# Patient Record
Sex: Female | Born: 1950 | Race: White | Hispanic: No | Marital: Married | Smoking: Never smoker
Health system: Southern US, Community
[De-identification: ages and names within clinical notes are randomized; demographics above are authoritative.]

## PROBLEM LIST (undated history)

## (undated) DIAGNOSIS — I10 Essential (primary) hypertension: Secondary | ICD-10-CM

## (undated) DIAGNOSIS — E669 Obesity, unspecified: Secondary | ICD-10-CM

## (undated) HISTORY — PX: CATARACT EXTRACTION W/ INTRAOCULAR LENS IMPLANT: SHX1309

## (undated) HISTORY — DX: Essential (primary) hypertension: I10

## (undated) HISTORY — PX: VEIN LIGATION AND STRIPPING: SHX2653

## (undated) HISTORY — DX: Obesity, unspecified: E66.9

---

## 1992-03-28 HISTORY — PX: ABDOMINAL HYSTERECTOMY: SHX81

## 2002-03-28 HISTORY — PX: ANTERIOR AND POSTERIOR VAGINAL REPAIR: SUR5

## 2012-07-11 ENCOUNTER — Encounter (INDEPENDENT_AMBULATORY_CARE_PROVIDER_SITE_OTHER): Payer: Self-pay | Admitting: Surgery

## 2012-07-11 ENCOUNTER — Ambulatory Visit (INDEPENDENT_AMBULATORY_CARE_PROVIDER_SITE_OTHER): Payer: 59 | Admitting: Surgery

## 2012-07-11 ENCOUNTER — Other Ambulatory Visit (INDEPENDENT_AMBULATORY_CARE_PROVIDER_SITE_OTHER): Payer: Self-pay

## 2012-07-11 NOTE — Progress Notes (Addendum)
Re:   Joy Charles DOB:   December 05, 1950 MRN:   [id number]  ASSESSMENT AND PLAN: 1.  Morbid obesity  Per the 1991 NIH Consensus Statement, the patient is a candidate for bariatric surgery.  The patient attended our initial information session and reviewed the types of bariatric surgery.    The patient is not sure what bariatric operation she wants.  We discussed lap band (her husband), sleeve (her husband's brother), and RYGB [facility], a patient of mine).  I discussed with the patient the indications and risks of bariatric surgery.  The potential risks of surgery include, but are not limited to, bleeding, infection, leak from the bowel, DVT and PE, open surgery, long term nutrition consequences, and death.  The patient understands the importance of compliance and long term follow-up with our group after surgery.  From here we will obtain lab tests, x-rays, nutrition consult, and psych consult.  She wants to do her psych eval near [place], which should be okay.  She brought some lab work with her.  2.  Hypertension 3.  GERD  [UGI - 07/25/2012 - small hiatal hernia.  DN 08/01/2012] [US - 07/15/2012 - gallstones.  DN 08/07/2012] [In the psych eval Joy Charles), there is mention of opening soft drinks 2 days before she drinks them so they are flat.  DN  09/19/2012]  Chief Complaint  Patient presents with  . Bariatric Pre-op    Initial   REFERRING PHYSICIAN: [name],[name], MD  HISTORY OF PRESENT ILLNESS: Joy Charles is a 61 y.o. (DOB: 1950/09/09)  white  female whose primary care physician is Joy Skeeters, MD Joy Charles) and comes to me today for bariatric surgery.  She has been overweight much of her adult life, particularly since she has had her kids.  She did one time diet down to 147 on Atkin's many years ago.  The patient's husband, Joy Charles, is a LAP-BAND patient of mine. I did his LAP-BAND on 11/03/2009 and he has been successful with weight loss. She has been to  one of our information sessions. She has tried multiple diets including Weight Watchers, Atkins, Slim fast, and the diet in which she did not eat after 2 PM. She is currently 4 of 6 months into a physicians supervised weight loss plan.  At this time, she is undecided about what bariatrics surgery she wants to pursue.  Her husband had the Lap Band.  They have a friend, Joy Charles ([id number]) who is a RYGB that I did.  They also have heard about the gastric sleeve.   Past Medical History  Diagnosis Date  . Hypertension       Past Surgical History  Procedure Laterality Date  . Abdominal hysterectomy    . Vein ligation and stripping        Current Outpatient Prescriptions  Medication Sig Dispense Refill  . amLODipine (NORVASC) 10 MG tablet Take 10 mg by mouth daily.      Marland Kitchen losartan (COZAAR) 25 MG tablet Take 25 mg by mouth daily.       No current facility-administered medications for this visit.      Allergies  Allergen Reactions  . Cephalosporins Hives and Rash  . Sulfur Rash    REVIEW OF SYSTEMS: Skin:  No history of rash.  No history of abnormal moles. Infection:   No history of MRSA. Neurologic:  No history of stroke.  No history of seizure.  No history of headaches. Cardiac:  Hypertension x 10 years.  History  of "phlebitis", but not DVT. Pulmonary:  Does not smoke cigarettes.  No asthma or bronchitis.  No OSA/CPAP.  Endocrine:  No diabetes. No thyroid disease. Gastrointestinal:  GERD, but doing well now and not on meds.  No history of liver disease.  No history of gall bladder disease.  No history of pancreas disease.  No history of colon disease.  Colonoscopy at [facility] at age 33. Urologic:  No history of kidney stones.  No history of bladder infections. Musculoskeletal:  No musculoskeletal disease. Hematologic:  No bleeding disorder.  No history of anemia.  Not anticoagulated. Psycho-social:  The patient is oriented.   The patient has no obvious psychologic or  social impairment to understanding our conversation and plan.  SOCIAL and FAMILY HISTORY: Joy Charles Joy Charles ([id number]), is lap band patient of mine. They have two boys, 33 and 34.  PHYSICAL EXAM: BP 142/64  Pulse 72  Temp(Src) 98.1 F (36.7 C) (Temporal)  Resp 16  Ht 5\' 3"  (1.6 m)  Wt 215 lb 9.6 oz (97.796 kg)  BMI 38.2 kg/m2  General: Obese WN WF who is alert and generally healthy appearing.  HEENT: Normal. Pupils equal. Neck: Supple. No mass.  No thyroid mass. Lymph Nodes:  No supraclavicular or cervical nodes. Lungs: Clear to auscultation and symmetric breath sounds. Heart:  RRR. No murmur or rub. Abdomen: Soft. No mass. No tenderness. No hernia. Normal bowel sounds.  She has a lower midline abdominal incision from hysterectomy/ruptured bladder.  She is a little more apple than pear in shape,. Rectal: No mass.  Guaiac neg. Extremities:  Good strength and ROM  in upper and lower extremities. Neurologic:  Grossly intact to motor and sensory function. Psychiatric: Has normal mood and affect. Behavior is normal.   DATA REVIEWED: Data from Dr. Truddie Crumble in [place], [place].  Joy Kin, MD,  [facility] [address]  [address]   [place], [place]    [address] Phone:  [phone] FAX:  [phone]

## 2012-07-16 ENCOUNTER — Encounter (INDEPENDENT_AMBULATORY_CARE_PROVIDER_SITE_OTHER): Payer: Self-pay

## 2012-07-25 ENCOUNTER — Other Ambulatory Visit: Payer: Self-pay

## 2012-07-25 ENCOUNTER — Ambulatory Visit (HOSPITAL_COMMUNITY)
Admission: RE | Admit: 2012-07-25 | Discharge: 2012-07-25 | Disposition: A | Discharge status: Home / Self Care | Payer: 59 | Source: Ambulatory Visit | Attending: Surgery | Admitting: Surgery

## 2012-07-25 DIAGNOSIS — K8020 Calculus of gallbladder without cholecystitis without obstruction: Secondary | ICD-10-CM | POA: Insufficient documentation

## 2012-07-25 DIAGNOSIS — I10 Essential (primary) hypertension: Secondary | ICD-10-CM | POA: Insufficient documentation

## 2012-07-25 DIAGNOSIS — Z6838 Body mass index (BMI) 38.0-38.9, adult: Secondary | ICD-10-CM | POA: Insufficient documentation

## 2012-07-25 DIAGNOSIS — K449 Diaphragmatic hernia without obstruction or gangrene: Secondary | ICD-10-CM | POA: Insufficient documentation

## 2012-07-25 DIAGNOSIS — K219 Gastro-esophageal reflux disease without esophagitis: Secondary | ICD-10-CM | POA: Insufficient documentation

## 2012-07-25 DIAGNOSIS — K224 Dyskinesia of esophagus: Secondary | ICD-10-CM | POA: Insufficient documentation

## 2012-07-25 DIAGNOSIS — K7689 Other specified diseases of liver: Secondary | ICD-10-CM | POA: Insufficient documentation

## 2012-08-01 ENCOUNTER — Ambulatory Visit (HOSPITAL_COMMUNITY)
Admission: RE | Admit: 2012-08-01 | Discharge: 2012-08-01 | Disposition: A | Discharge status: Home / Self Care | Payer: 59 | Source: Ambulatory Visit | Attending: Surgery | Admitting: Surgery

## 2012-08-01 ENCOUNTER — Encounter: Payer: 59 | Attending: Surgery | Admitting: *Deleted

## 2012-08-01 ENCOUNTER — Encounter: Payer: Self-pay | Admitting: *Deleted

## 2012-08-01 ENCOUNTER — Encounter (HOSPITAL_COMMUNITY)
Admission: RE | Disposition: A | Discharge status: Home / Self Care | Payer: Self-pay | Source: Ambulatory Visit | Attending: Surgery

## 2012-08-01 DIAGNOSIS — Z01818 Encounter for other preprocedural examination: Secondary | ICD-10-CM | POA: Insufficient documentation

## 2012-08-01 DIAGNOSIS — E669 Obesity, unspecified: Secondary | ICD-10-CM | POA: Insufficient documentation

## 2012-08-01 DIAGNOSIS — Z713 Dietary counseling and surveillance: Secondary | ICD-10-CM | POA: Insufficient documentation

## 2012-08-01 HISTORY — PX: BREATH TEK H PYLORI: SHX5422

## 2012-08-01 SURGERY — BREATH TEST, FOR HELICOBACTER PYLORI

## 2012-08-01 NOTE — Progress Notes (Signed)
  Pre-Op Assessment Visit:  Pre-Operative LAGB Surgery  Medical Nutrition Therapy:  Appt start time: 0915   End time:  1015.  Patient was seen on 08/01/2012 for Pre-Operative LAGB Nutrition Assessment. Assessment and letter of approval faxed to [facility] Bariatric Surgery Program coordinator on 08/01/2012.  Approval letter sent to [facility] center and will be available in the chart under the media tab.  Handouts given during visit include:  Pre-Op Goals   Bariatric Surgery Protein Shakes  Samples given during visit include:   Unjury Protein Powder: 1 pkt Lot: [id number]; Exp: 04/15  FreedaVite MVI: 2 bottles @ 5 tabs/bottle Lot: [id number]; Exp: 03/17  Patient to call for Pre-Op and Post-Op Nutrition Education at [facility] when surgery is scheduled.

## 2012-08-01 NOTE — Patient Instructions (Addendum)
   Follow Pre-Op Nutrition Goals to prepare for Lapband Surgery.   Call the [facility] at [phone] once you have been given your surgery date to enrolled in the Pre-Op Nutrition Class. You will need to attend this nutrition class 3-4 weeks prior to your surgery. 

## 2012-08-02 ENCOUNTER — Encounter (HOSPITAL_COMMUNITY): Payer: Self-pay | Admitting: Surgery

## 2012-11-14 ENCOUNTER — Other Ambulatory Visit (INDEPENDENT_AMBULATORY_CARE_PROVIDER_SITE_OTHER): Payer: Self-pay | Admitting: Surgery

## 2012-12-25 ENCOUNTER — Encounter (INDEPENDENT_AMBULATORY_CARE_PROVIDER_SITE_OTHER): Payer: Self-pay

## 2012-12-28 ENCOUNTER — Ambulatory Visit (INDEPENDENT_AMBULATORY_CARE_PROVIDER_SITE_OTHER): Payer: 59 | Admitting: Surgery

## 2013-01-01 ENCOUNTER — Encounter: Payer: Self-pay | Admitting: *Deleted

## 2013-01-01 NOTE — Progress Notes (Signed)
Email sent to patient at [email or web address] explaining that she DOES need to attend Pre-Op Class and Post-Op Class. Also urged her to attend f/u appts to increase the success of her surgery.

## 2013-01-03 ENCOUNTER — Encounter (INDEPENDENT_AMBULATORY_CARE_PROVIDER_SITE_OTHER): Payer: Self-pay | Admitting: Surgery

## 2013-01-03 ENCOUNTER — Ambulatory Visit (INDEPENDENT_AMBULATORY_CARE_PROVIDER_SITE_OTHER): Payer: 59 | Admitting: Surgery

## 2013-01-03 DIAGNOSIS — I10 Essential (primary) hypertension: Secondary | ICD-10-CM

## 2013-01-03 DIAGNOSIS — Z6838 Body mass index (BMI) 38.0-38.9, adult: Secondary | ICD-10-CM

## 2013-01-03 NOTE — Progress Notes (Addendum)
Re:   Joy Charles DOB:   24-Nov-1950 MRN:   [id number]  ASSESSMENT AND PLAN: 1.  Morbid obesity  Per the 1991 NIH Consensus Statement, the patient is a candidate for bariatric surgery.  The patient attended our initial information session and reviewed the types of bariatric surgery.    The patient is not sure what bariatric operation she wants.  We discussed lap band (her husband), sleeve (her husband's brother), and RYGB Joy Charles, a patient of mine).  I discussed with the patient the indications and risks of bariatric surgery.  The potential risks of surgery include, but are not limited to, bleeding, infection, leak from the bowel, DVT and PE, open surgery, long term nutrition consequences, and death.  The patient understands the importance of compliance and long term follow-up with our group after surgery.  The patient has decided to have a lap band.  We discussed this decision.  She lives in [place], so we will keep her overnight.  She knows the immediate risks of lap band surgery and the long term risks with the lap band (including slippage and erosion of the lap band).  Patient has a small HH on UGI.  Will check this at the time of surgery and repair if necessary.  2.  Hypertension 3.  GERD  She has a small HH on UGI - we talked about repair of the [facility] 4.  Gallstones  Her mother and GM had gall stones.  We talked about following this.  I gave her a book on gall bladder disease.  Chief Complaint  Patient presents with  . Bariatric Pre-op    lap band   REFERRING PHYSICIAN: [name],[name], MD  HISTORY OF PRESENT ILLNESS: Joy Charles is a 62 y.o. (DOB: 04/13/1950)  white  female whose primary care physician is Joy Skeeters, MD Joy Charles) and comes to me today for bariatric surgery.   She is with her husband. I reviewed her evaluations and tests.  I answered questions about the lap band and the surgery.  I think she has a good handle on this because her  husband has done well.  UGI - 07/25/2012 - small hiatal hernia. [place] - 07/15/2012 - gallstones.   In the psych eval Joy Charles), there is mention of opening soft drinks 2 days before she drinks them so they are flat.  DN  09/19/2012  History of obesity: She has been overweight much of her adult life, particularly since she has had her kids.  She did one time diet down to 147 on Atkin's many years ago.  The patient's husband, Joy Charles, is a LAP-BAND patient of mine. I did his LAP-BAND on 11/03/2009 and he has been successful with weight loss. She has been to one of our information sessions. She has tried multiple diets including Weight Watchers, Atkins, Slim fast, and the diet in which she did not eat after 2 PM. She is currently 4 of 6 months into a physicians supervised weight loss plan.  At this time, she is undecided about what bariatrics surgery she wants to pursue.  Her husband had the Lap Band.  They have a friend, Joy Charles ([id number]) who is a RYGB that I did.  They also have heard about the gastric sleeve.   Past Medical History  Diagnosis Date  . Hypertension   . Obesity       Past Surgical History  Procedure Laterality Date  . Vein ligation and stripping    . Anterior and posterior  vaginal repair  2004  . Abdominal hysterectomy  1994  . Breath tek h pylori N/A 08/01/2012    Procedure: BREATH TEK H PYLORI;  Surgeon: Kandis Cocking, MD;  Location: [facility];  Service: General;  Laterality: N/A;      Current Outpatient Prescriptions  Medication Sig Dispense Refill  . amLODipine (NORVASC) 10 MG tablet Take 10 mg by mouth daily.      Marland Kitchen BLACK COHOSH PO Take 40 mg by mouth daily.       . fish oil-omega-3 fatty acids 1000 MG capsule Take 1,290 mg by mouth daily.       Marland Kitchen losartan (COZAAR) 25 MG tablet Take 25 mg by mouth daily.      . Multiple Vitamins-Minerals (MULTIVITAL) tablet Take 1 tablet by mouth daily.       No current facility-administered medications for this  visit.      Allergies  Allergen Reactions  . Cephalosporins Hives and Rash  . Sulfa Antibiotics Hives and Itching  . Sulfur Rash    REVIEW OF SYSTEMS: Skin:  No history of rash.  No history of abnormal moles. Infection:   No history of MRSA. Neurologic:  No history of stroke.  No history of seizure.  No history of headaches. Cardiac:  Hypertension x 10 years.  History of "phlebitis", but not DVT. Pulmonary:  Does not smoke cigarettes.  No asthma or bronchitis.  No OSA/CPAP.  Endocrine:  No diabetes. No thyroid disease. Gastrointestinal:  GERD, but doing well now and not on meds.  No history of liver disease.  No history of gall bladder disease.  No history of pancreas disease.  No history of colon disease.  Colonoscopy at [facility] at age 45. Urologic:  No history of kidney stones.  No history of bladder infections. GYN:  History of hysterectomy and ruptured bladder - 1984. Musculoskeletal:  No musculoskeletal disease. Hematologic:  No bleeding disorder.  No history of anemia.  Not anticoagulated. Psycho-social:  The patient is oriented.   The patient has no obvious psychologic or social impairment to understanding our conversation and plan.  SOCIAL and FAMILY HISTORY: Joy Charles Joy Charles ([id number]), is lap band patient of mine. They have two boys, 33 and 34. 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  PHYSICAL EXAM: BP 146/88  Pulse 80  Temp(Src) 98.2 F (36.8 C) (Temporal)  Resp 16  Ht 5\' 3"  (1.6 m)  Wt 214 lb 12.8 oz (97.433 kg)  BMI 38.06 kg/m2  General: Obese WN WF who is alert and generally healthy appearing.  HEENT: Normal. Pupils equal. Neck: Supple. No mass.  No thyroid mass. Lymph Nodes:  No supraclavicular or cervical nodes. Lungs: Clear to auscultation and symmetric breath sounds. Heart:  RRR. No murmur or rub. Abdomen: Soft. No mass. No tenderness. No hernia. Normal bowel sounds.  She has a lower midline abdominal incision  from hysterectomy/ruptured bladder.  She is a little more apple than pear in shape. Rectal: No mass.  Guaiac neg. Extremities:  Good strength and ROM  in upper and lower extremities. Neurologic:  Grossly intact to motor and sensory function. Psychiatric: Has normal mood and affect. Behavior is normal.   DATA REVIEWED: Data from Dr. Truddie Crumble in [place], [place].  Ovidio Kin, MD,  [facility] [address]  [address]   [place], [place]    [address] Phone:  [phone] FAX:  [phone]

## 2013-01-04 ENCOUNTER — Encounter (HOSPITAL_COMMUNITY): Payer: Self-pay | Admitting: Pharmacy Technician

## 2013-01-10 ENCOUNTER — Encounter (HOSPITAL_COMMUNITY): Payer: Self-pay

## 2013-01-10 ENCOUNTER — Ambulatory Visit (HOSPITAL_COMMUNITY)
Admission: RE | Admit: 2013-01-10 | Discharge: 2013-01-10 | Disposition: A | Discharge status: Home / Self Care | Payer: 59 | Source: Ambulatory Visit | Attending: Surgery | Admitting: Surgery

## 2013-01-10 ENCOUNTER — Encounter (HOSPITAL_COMMUNITY)
Admission: RE | Admit: 2013-01-10 | Discharge: 2013-01-10 | Disposition: A | Discharge status: Home / Self Care | Payer: 59 | Source: Ambulatory Visit | Attending: Surgery | Admitting: Surgery

## 2013-01-10 DIAGNOSIS — Z01818 Encounter for other preprocedural examination: Secondary | ICD-10-CM | POA: Insufficient documentation

## 2013-01-10 DIAGNOSIS — Z01812 Encounter for preprocedural laboratory examination: Secondary | ICD-10-CM | POA: Insufficient documentation

## 2013-01-10 DIAGNOSIS — E669 Obesity, unspecified: Secondary | ICD-10-CM | POA: Insufficient documentation

## 2013-01-10 DIAGNOSIS — I10 Essential (primary) hypertension: Secondary | ICD-10-CM | POA: Insufficient documentation

## 2013-01-10 LAB — CBC WITH DIFFERENTIAL/PLATELET
Eosinophils Relative: 1 % (ref 0–5)
HCT: 44.6 % (ref 36.0–46.0)
Hemoglobin: 15.6 g/dL — ABNORMAL HIGH (ref 12.0–15.0)
Lymphocytes Relative: 24 % (ref 12–46)
Lymphs Abs: 1.8 10*3/uL (ref 0.7–4.0)
MCV: 92.1 fL (ref 78.0–100.0)
Monocytes Relative: 8 % (ref 3–12)
Platelets: 287 10*3/uL (ref 150–400)
RBC: 4.84 MIL/uL (ref 3.87–5.11)
WBC: 7.6 10*3/uL (ref 4.0–10.5)

## 2013-01-10 LAB — COMPREHENSIVE METABOLIC PANEL
ALT: 42 U/L — ABNORMAL HIGH (ref 0–35)
Alkaline Phosphatase: 89 U/L (ref 39–117)
CO2: 28 mEq/L (ref 19–32)
Calcium: 10.2 mg/dL (ref 8.4–10.5)
GFR calc Af Amer: >90 mL/min (ref >90)
GFR calc non Af Amer: 78 mL/min — ABNORMAL LOW (ref >90)
Glucose, Bld: 107 mg/dL — ABNORMAL HIGH (ref 70–99)
Potassium: 4.6 mEq/L (ref 3.5–5.1)
Sodium: 139 mEq/L (ref 135–145)

## 2013-01-10 NOTE — Progress Notes (Signed)
01-10-13 1615 Labs viewable in Epic.

## 2013-01-10 NOTE — Patient Instructions (Addendum)
22 Joy Charles  01/10/2013   Your procedure is scheduled on:   01-15-2013  Report to [facility] at    0745    AM .  Call this number if you have problems the morning of surgery: [phone]  Or Presurgical Testing [phone]([place]bert [place]ulsebus)   Remember: Follow any bowel prep instructions per MD office.    Do not eat food:After Midnight.    Take these medicines the morning of surgery with A SIP OF WATER: Amlodipine.   Do not wear jewelry, make-up or nail polish.  Do not wear lotions, powders, or perfumes. You may wear deodorant.  Do not shave 12 hours prior to first CHG shower(legs and under arms).(face and neck okay.)  Do not bring valuables to the hospital.  Contacts, dentures or bridgework,body piercing,  may not be worn into surgery.  Leave suitcase in the car. After surgery it may be brought to your room.  For patients admitted to the hospital, checkout time is 11:00 AM the day of discharge.   Patients discharged the day of surgery will not be allowed to drive home. Must have responsible person with you x 24 hours once discharged.  Name and phone number of your driver: spouse [name] -[phone] cell  Special Instructions: CHG(Chlorhedine 4%-"Hibiclens","Betasept","Aplicare") Shower Use Special Wash: see special instructions.(avoid face and genitals)   Please read over the following fact sheets that you were given: Incentive Spirometry Instruction.    Failure to follow these instructions may result in Cancellation of your surgery.   Patient signature_______________________________________________________

## 2013-01-10 NOTE — Pre-Procedure Instructions (Addendum)
01-10-13 CXR today. EKG 4'14 -Epic. 01-10-13 1615 labs viewable in Epic.

## 2013-01-15 ENCOUNTER — Observation Stay (HOSPITAL_COMMUNITY)
Admission: RE | Admit: 2013-01-15 | Discharge: 2013-01-16 | Disposition: A | Discharge status: Home / Self Care | Payer: 59 | Source: Ambulatory Visit | Attending: Surgery | Admitting: Surgery

## 2013-01-15 ENCOUNTER — Encounter (HOSPITAL_COMMUNITY): Payer: Self-pay | Admitting: *Deleted

## 2013-01-15 ENCOUNTER — Encounter (HOSPITAL_COMMUNITY)
Admission: RE | Disposition: A | Discharge status: Home / Self Care | Payer: Self-pay | Source: Ambulatory Visit | Attending: Surgery

## 2013-01-15 ENCOUNTER — Encounter (HOSPITAL_COMMUNITY): Payer: 59 | Admitting: Anesthesiology

## 2013-01-15 ENCOUNTER — Ambulatory Visit (HOSPITAL_COMMUNITY): Payer: 59 | Admitting: Anesthesiology

## 2013-01-15 ENCOUNTER — Observation Stay (HOSPITAL_COMMUNITY): Payer: 59

## 2013-01-15 DIAGNOSIS — K449 Diaphragmatic hernia without obstruction or gangrene: Secondary | ICD-10-CM | POA: Insufficient documentation

## 2013-01-15 DIAGNOSIS — K219 Gastro-esophageal reflux disease without esophagitis: Secondary | ICD-10-CM | POA: Insufficient documentation

## 2013-01-15 DIAGNOSIS — K8020 Calculus of gallbladder without cholecystitis without obstruction: Secondary | ICD-10-CM | POA: Insufficient documentation

## 2013-01-15 DIAGNOSIS — Z6838 Body mass index (BMI) 38.0-38.9, adult: Secondary | ICD-10-CM | POA: Insufficient documentation

## 2013-01-15 DIAGNOSIS — Z79899 Other long term (current) drug therapy: Secondary | ICD-10-CM | POA: Insufficient documentation

## 2013-01-15 DIAGNOSIS — I10 Essential (primary) hypertension: Secondary | ICD-10-CM

## 2013-01-15 HISTORY — PX: MESH APPLIED TO LAP PORT: SHX5969

## 2013-01-15 HISTORY — PX: LAPAROSCOPIC GASTRIC BANDING WITH HIATAL HERNIA REPAIR: SHX6351

## 2013-01-15 SURGERY — GASTRIC BANDING, LAPAROSCOPIC, WITH HIATAL HERNIA REPAIR
Anesthesia: General | Site: Abdomen | Wound class: Clean

## 2013-01-15 MED ORDER — BUPIVACAINE-EPINEPHRINE 0.5% -1:200000 IJ SOLN
INTRAMUSCULAR | Status: AC
  Filled 2013-01-15: qty 1

## 2013-01-15 MED ORDER — FENTANYL CITRATE 0.05 MG/ML IJ SOLN
INTRAMUSCULAR | Status: DC | PRN
  Administered 2013-01-15: 50 ug via INTRAVENOUS
  Administered 2013-01-15: 100 ug via INTRAVENOUS
  Administered 2013-01-15: 50 ug via INTRAVENOUS

## 2013-01-15 MED ORDER — SUCCINYLCHOLINE CHLORIDE 20 MG/ML IJ SOLN
INTRAMUSCULAR | Status: DC | PRN
  Administered 2013-01-15: 180 mg via INTRAVENOUS

## 2013-01-15 MED ORDER — 0.9 % SODIUM CHLORIDE (POUR BTL) OPTIME
TOPICAL | Status: DC | PRN
  Administered 2013-01-15: 1000 mL

## 2013-01-15 MED ORDER — SODIUM CHLORIDE 0.9 % IJ SOLN
INTRAMUSCULAR | Status: AC
  Filled 2013-01-15: qty 10

## 2013-01-15 MED ORDER — SODIUM CHLORIDE 0.9 % IJ SOLN
INTRAMUSCULAR | Status: DC | PRN
  Administered 2013-01-15: 10 mL via INTRAVENOUS

## 2013-01-15 MED ORDER — MORPHINE SULFATE 2 MG/ML IJ SOLN: 2.0000 mg | INTRAMUSCULAR | Status: DC | PRN

## 2013-01-15 MED ORDER — KETOROLAC TROMETHAMINE 30 MG/ML IJ SOLN
INTRAMUSCULAR | Status: DC | PRN
  Administered 2013-01-15: 30 mg via INTRAVENOUS

## 2013-01-15 MED ORDER — UNJURY VANILLA POWDER: 2.0000 [oz_av] | Freq: Four times a day (QID) | ORAL | Status: DC

## 2013-01-15 MED ORDER — BIOTENE DRY MOUTH MT LIQD
15.0000 mL | Freq: Two times a day (BID) | OROMUCOSAL | Status: DC
Start: 2013-01-16 — End: 2013-01-16

## 2013-01-15 MED ORDER — CHLORHEXIDINE GLUCONATE 0.12 % MT SOLN
15.0000 mL | Freq: Two times a day (BID) | OROMUCOSAL | Status: DC
  Administered 2013-01-15: 15 mL via OROMUCOSAL
  Filled 2013-01-15 (×4): qty 15

## 2013-01-15 MED ORDER — POTASSIUM CHLORIDE IN NACL 20-0.45 MEQ/L-% IV SOLN
INTRAVENOUS | Status: DC
  Administered 2013-01-15 – 2013-01-16 (×2): via INTRAVENOUS
  Filled 2013-01-15 (×3): qty 1000

## 2013-01-15 MED ORDER — EPHEDRINE SULFATE 50 MG/ML IJ SOLN
INTRAMUSCULAR | Status: DC | PRN
  Administered 2013-01-15: 10 mg via INTRAVENOUS

## 2013-01-15 MED ORDER — UNJURY CHICKEN SOUP POWDER: 2.0000 [oz_av] | Freq: Four times a day (QID) | ORAL | Status: DC

## 2013-01-15 MED ORDER — AMLODIPINE BESYLATE 10 MG PO TABS
10.0000 mg | ORAL_TABLET | Freq: Every morning | ORAL | Status: DC
  Filled 2013-01-15: qty 1

## 2013-01-15 MED ORDER — UNJURY CHOCOLATE CLASSIC POWDER
2.0000 [oz_av] | Freq: Four times a day (QID) | ORAL | Status: DC
  Administered 2013-01-16: 2 [oz_av] via ORAL

## 2013-01-15 MED ORDER — PHENYLEPHRINE HCL 10 MG/ML IJ SOLN
INTRAMUSCULAR | Status: DC | PRN
  Administered 2013-01-15 (×2): 80 ug via INTRAVENOUS
  Administered 2013-01-15: 40 ug via INTRAVENOUS
  Administered 2013-01-15 (×5): 80 ug via INTRAVENOUS

## 2013-01-15 MED ORDER — PROMETHAZINE HCL 25 MG/ML IJ SOLN
6.2500 mg | INTRAMUSCULAR | Status: AC | PRN
  Administered 2013-01-15 (×2): 6.25 mg via INTRAVENOUS

## 2013-01-15 MED ORDER — PROMETHAZINE HCL 25 MG/ML IJ SOLN
INTRAMUSCULAR | Status: AC
  Filled 2013-01-15: qty 1

## 2013-01-15 MED ORDER — ACETAMINOPHEN 160 MG/5ML PO SOLN: 650.0000 mg | ORAL | Status: DC | PRN

## 2013-01-15 MED ORDER — LEVOFLOXACIN IN D5W 750 MG/150ML IV SOLN
750.0000 mg | INTRAVENOUS | Status: AC
  Administered 2013-01-15: 750 mg via INTRAVENOUS
  Filled 2013-01-15: qty 150

## 2013-01-15 MED ORDER — LOSARTAN POTASSIUM 25 MG PO TABS
25.0000 mg | ORAL_TABLET | Freq: Every morning | ORAL | Status: DC
  Filled 2013-01-15: qty 1

## 2013-01-15 MED ORDER — LIDOCAINE HCL (CARDIAC) 20 MG/ML IV SOLN
INTRAVENOUS | Status: DC | PRN
  Administered 2013-01-15: 100 mg via INTRAVENOUS

## 2013-01-15 MED ORDER — HEPARIN SODIUM (PORCINE) 5000 UNIT/ML IJ SOLN
5000.0000 [IU] | INTRAMUSCULAR | Status: AC
  Administered 2013-01-15: 5000 [IU] via SUBCUTANEOUS
  Filled 2013-01-15: qty 1

## 2013-01-15 MED ORDER — LACTATED RINGERS IV SOLN: INTRAVENOUS | Status: DC

## 2013-01-15 MED ORDER — PROPOFOL 10 MG/ML IV BOLUS
INTRAVENOUS | Status: DC | PRN
  Administered 2013-01-15: 200 mg via INTRAVENOUS

## 2013-01-15 MED ORDER — GLYCOPYRROLATE 0.2 MG/ML IJ SOLN
INTRAMUSCULAR | Status: DC | PRN
  Administered 2013-01-15: .6 mg via INTRAVENOUS

## 2013-01-15 MED ORDER — LACTATED RINGERS IV SOLN
INTRAVENOUS | Status: DC
  Administered 2013-01-15: 1000 mL via INTRAVENOUS
  Administered 2013-01-15 (×2): via INTRAVENOUS

## 2013-01-15 MED ORDER — ONDANSETRON HCL 4 MG/2ML IJ SOLN: 4.0000 mg | INTRAMUSCULAR | Status: DC | PRN

## 2013-01-15 MED ORDER — ONDANSETRON HCL 4 MG/2ML IJ SOLN
INTRAMUSCULAR | Status: DC | PRN
  Administered 2013-01-15: 4 mg via INTRAVENOUS

## 2013-01-15 MED ORDER — ROCURONIUM BROMIDE 100 MG/10ML IV SOLN
INTRAVENOUS | Status: DC | PRN
  Administered 2013-01-15: 35 mg via INTRAVENOUS
  Administered 2013-01-15: 5 mg via INTRAVENOUS
  Administered 2013-01-15: 10 mg via INTRAVENOUS

## 2013-01-15 MED ORDER — SODIUM CHLORIDE 0.9 % IJ SOLN
INTRAMUSCULAR | Status: AC
  Filled 2013-01-15: qty 50

## 2013-01-15 MED ORDER — OXYCODONE-ACETAMINOPHEN 5-325 MG/5ML PO SOLN: 5.0000 mL | ORAL | Status: DC | PRN

## 2013-01-15 MED ORDER — MIDAZOLAM HCL 5 MG/5ML IJ SOLN
INTRAMUSCULAR | Status: DC | PRN
  Administered 2013-01-15: 2 mg via INTRAVENOUS

## 2013-01-15 MED ORDER — NEOSTIGMINE METHYLSULFATE 1 MG/ML IJ SOLN
INTRAMUSCULAR | Status: DC | PRN
  Administered 2013-01-15: 4 mg via INTRAVENOUS

## 2013-01-15 MED ORDER — MEPERIDINE HCL 50 MG/ML IJ SOLN: 6.2500 mg | INTRAMUSCULAR | Status: DC | PRN

## 2013-01-15 MED ORDER — HEPARIN SODIUM (PORCINE) 5000 UNIT/ML IJ SOLN
5000.0000 [IU] | Freq: Three times a day (TID) | INTRAMUSCULAR | Status: DC
  Administered 2013-01-15: 5000 [IU] via SUBCUTANEOUS
  Filled 2013-01-15 (×5): qty 1

## 2013-01-15 MED ORDER — BUPIVACAINE-EPINEPHRINE PF 0.5-1:200000 % IJ SOLN
INTRAMUSCULAR | Status: DC | PRN
  Administered 2013-01-15: 10 mL

## 2013-01-15 SURGICAL SUPPLY — 53 items
BAND LAP 10.0 W/TUBES (Band) ×2 IMPLANT
BENZOIN TINCTURE PRP APPL 2/3 (GAUZE/BANDAGES/DRESSINGS) IMPLANT
BLADE HEX COATED 2.75 (ELECTRODE) ×2 IMPLANT
BLADE SURG 15 STRL LF DISP TIS (BLADE) ×1 IMPLANT
BLADE SURG 15 STRL SS (BLADE) ×1
CANISTER SUCTION 2500CC (MISCELLANEOUS) ×2 IMPLANT
CHLORAPREP W/TINT 26ML (MISCELLANEOUS) ×2 IMPLANT
CLOTH BEACON ORANGE TIMEOUT ST (SAFETY) ×2 IMPLANT
DECANTER SPIKE VIAL GLASS SM (MISCELLANEOUS) ×2 IMPLANT
DEVICE SUT QUICK LOAD TK 5 (STAPLE) ×6 IMPLANT
DEVICE SUT TI-KNOT TK 5X26 (MISCELLANEOUS) ×6 IMPLANT
DEVICE SUTURE ENDOST 10MM (ENDOMECHANICALS) ×2 IMPLANT
DISSECTOR BLUNT TIP ENDO 5MM (MISCELLANEOUS) IMPLANT
DRAPE CAMERA CLOSED 9X96 (DRAPES) ×2 IMPLANT
DRAPE UTILITY XL STRL (DRAPES) ×2 IMPLANT
ELECT REM PT RETURN 9FT ADLT (ELECTROSURGICAL) ×2
ELECTRODE REM PT RTRN 9FT ADLT (ELECTROSURGICAL) ×1 IMPLANT
GLOVE BIOGEL PI IND STRL 7.0 (GLOVE) ×1 IMPLANT
GLOVE BIOGEL PI INDICATOR 7.0 (GLOVE) ×1
GOWN PREVENTION PLUS LG XLONG (DISPOSABLE) ×2 IMPLANT
GOWN STRL REIN XL XLG (GOWN DISPOSABLE) ×6 IMPLANT
HOVERMATT SINGLE USE (MISCELLANEOUS) ×2 IMPLANT
KIT BASIN OR (CUSTOM PROCEDURE TRAY) ×2 IMPLANT
MESH HERNIA 1X4 RECT BARD (Mesh General) ×1 IMPLANT
MESH HERNIA BARD 1X4 (Mesh General) ×1 IMPLANT
NEEDLE SPNL 22GX3.5 QUINCKE BK (NEEDLE) ×2 IMPLANT
NS IRRIG 1000ML POUR BTL (IV SOLUTION) ×2 IMPLANT
PACK UNIVERSAL I (CUSTOM PROCEDURE TRAY) ×2 IMPLANT
PENCIL BUTTON HOLSTER BLD 10FT (ELECTRODE) ×2 IMPLANT
SCALPEL HARMONIC ACE (MISCELLANEOUS) IMPLANT
SET IRRIG TUBING LAPAROSCOPIC (IRRIGATION / IRRIGATOR) IMPLANT
SLEEVE XCEL OPT CAN 5 100 (ENDOMECHANICALS) ×8 IMPLANT
SOLUTION ANTI FOG 6CC (MISCELLANEOUS) ×2 IMPLANT
SPONGE GAUZE 4X4 12PLY (GAUZE/BANDAGES/DRESSINGS) ×2 IMPLANT
SPONGE LAP 18X18 X RAY DECT (DISPOSABLE) ×2 IMPLANT
STAPLER VISISTAT 35W (STAPLE) ×2 IMPLANT
STRIP CLOSURE SKIN 1/2X4 (GAUZE/BANDAGES/DRESSINGS) IMPLANT
SUT ETHIBOND 2 0 SH (SUTURE) ×3
SUT ETHIBOND 2 0 SH 36X2 (SUTURE) ×3 IMPLANT
SUT MON AB 5-0 PS2 18 (SUTURE) ×2 IMPLANT
SUT PROLENE 2 0 CT2 30 (SUTURE) ×2 IMPLANT
SUT SILK 0 (SUTURE)
SUT SILK 0 30XBRD TIE 6 (SUTURE) IMPLANT
SUT SURGIDAC NAB ES-9 0 48 120 (SUTURE) ×2 IMPLANT
SUT VIC AB 2-0 SH 27 (SUTURE)
SUT VIC AB 2-0 SH 27X BRD (SUTURE) IMPLANT
SYR 20CC LL (SYRINGE) ×2 IMPLANT
SYR 30ML LL (SYRINGE) ×2 IMPLANT
TOWEL OR 17X26 10 PK STRL BLUE (TOWEL DISPOSABLE) ×6 IMPLANT
TROCAR BLADELESS 15MM (ENDOMECHANICALS) ×2 IMPLANT
TROCAR BLADELESS OPT 5 100 (ENDOMECHANICALS) ×2 IMPLANT
TUBE CALIBRATION LAPBAND (TUBING) ×2 IMPLANT
TUBING INSUFFLATION 10FT LAP (TUBING) ×2 IMPLANT

## 2013-01-15 NOTE — H&P (View-Only) (Signed)
Re:   Joy Charles DOB:   18-Mar-1951 MRN:   [id number]  ASSESSMENT AND PLAN: 1.  Morbid obesity  Per the 1991 NIH Consensus Statement, the patient is a candidate for bariatric surgery.  The patient attended our initial information session and reviewed the types of bariatric surgery.    The patient is not sure what bariatric operation she wants.  We discussed lap band (her husband), sleeve (her husband's brother), and RYGB [facility], a patient of mine).  I discussed with the patient the indications and risks of bariatric surgery.  The potential risks of surgery include, but are not limited to, bleeding, infection, leak from the bowel, DVT and PE, open surgery, long term nutrition consequences, and death.  The patient understands the importance of compliance and long term follow-up with our group after surgery.  The patient has decided to have a lap band.  We discussed this decision.  She lives in [place], so we will keep her overnight.  She knows the immediate risks of lap band surgery and the long term risks with the lap band (including slippage and erosion of the lap band).  Patient has a small HH on UGI.  Will check this at the time of surgery and repair if necessary.  2.  Hypertension 3.  GERD  She has a small HH on UGI - we talked about repair of the [facility] 4.  Gallstones  Her mother and GM had gall stones.  We talked about following this.  I gave her a book on gall bladder disease.  Chief Complaint  Patient presents with  . Bariatric Pre-op    lap band   REFERRING PHYSICIAN: [name],[name], MD  HISTORY OF PRESENT ILLNESS: Joy Charles is a 62 y.o. (DOB: 1950-10-23)  white  female whose primary care physician is Joy Skeeters, MD Joy Charles) and comes to me today for bariatric surgery.   She is with her husband. I reviewed her evaluations and tests.  I answered questions about the lap band and the surgery.  I think she has a good handle on this because her  husband has done well.  UGI - 07/25/2012 - small hiatal hernia. [place] - 07/15/2012 - gallstones.   In the psych eval Joy Charles), there is mention of opening soft drinks 2 days before she drinks them so they are flat.  DN  09/19/2012  History of obesity: She has been overweight much of her adult life, particularly since she has had her kids.  She did one time diet down to 147 on Atkin's many years ago.  The patient's husband, Joy Charles, is a LAP-BAND patient of mine. I did his LAP-BAND on 11/03/2009 and he has been successful with weight loss. She has been to one of our information sessions. She has tried multiple diets including Weight Watchers, Atkins, Slim fast, and the diet in which she did not eat after 2 PM. She is currently 4 of 6 months into a physicians supervised weight loss plan.  At this time, she is undecided about what bariatrics surgery she wants to pursue.  Her husband had the Lap Band.  They have a friend, Joy Charles ([id number]) who is a RYGB that I did.  They also have heard about the gastric sleeve.   Past Medical History  Diagnosis Date  . Hypertension   . Obesity       Past Surgical History  Procedure Laterality Date  . Vein ligation and stripping    . Anterior and posterior  vaginal repair  2004  . Abdominal hysterectomy  1994  . Breath tek h pylori N/A 08/01/2012    Procedure: BREATH TEK H PYLORI;  Surgeon: Kandis Cocking, MD;  Location: [facility];  Service: General;  Laterality: N/A;      Current Outpatient Prescriptions  Medication Sig Dispense Refill  . amLODipine (NORVASC) 10 MG tablet Take 10 mg by mouth daily.      Marland Kitchen BLACK COHOSH PO Take 40 mg by mouth daily.       . fish oil-omega-3 fatty acids 1000 MG capsule Take 1,290 mg by mouth daily.       Marland Kitchen losartan (COZAAR) 25 MG tablet Take 25 mg by mouth daily.      . Multiple Vitamins-Minerals (MULTIVITAL) tablet Take 1 tablet by mouth daily.       No current facility-administered medications for this  visit.      Allergies  Allergen Reactions  . Cephalosporins Hives and Rash  . Sulfa Antibiotics Hives and Itching  . Sulfur Rash    REVIEW OF SYSTEMS: Skin:  No history of rash.  No history of abnormal moles. Infection:   No history of MRSA. Neurologic:  No history of stroke.  No history of seizure.  No history of headaches. Cardiac:  Hypertension x 10 years.  History of "phlebitis", but not DVT. Pulmonary:  Does not smoke cigarettes.  No asthma or bronchitis.  No OSA/CPAP.  Endocrine:  No diabetes. No thyroid disease. Gastrointestinal:  GERD, but doing well now and not on meds.  No history of liver disease.  No history of gall bladder disease.  No history of pancreas disease.  No history of colon disease.  Colonoscopy at [facility] at age 62. Urologic:  No history of kidney stones.  No history of bladder infections. GYN:  History of hysterectomy and ruptured bladder - 1984. Musculoskeletal:  No musculoskeletal disease. Hematologic:  No bleeding disorder.  No history of anemia.  Not anticoagulated. Psycho-social:  The patient is oriented.   The patient has no obvious psychologic or social impairment to understanding our conversation and plan.  SOCIAL and FAMILY HISTORY: Joy Charles Joy Charles ([id number]), is lap band patient of mine. They have two boys, 33 and 34.  PHYSICAL EXAM: BP 146/88  Pulse 80  Temp(Src) 98.2 F (36.8 C) (Temporal)  Resp 16  Ht 5\' 3"  (1.6 m)  Wt 214 lb 12.8 oz (97.433 kg)  BMI 38.06 kg/m2  General: Obese WN WF who is alert and generally healthy appearing.  HEENT: Normal. Pupils equal. Neck: Supple. No mass.  No thyroid mass. Lymph Nodes:  No supraclavicular or cervical nodes. Lungs: Clear to auscultation and symmetric breath sounds. Heart:  RRR. No murmur or rub. Abdomen: Soft. No mass. No tenderness. No hernia. Normal bowel sounds.  She has a lower midline abdominal incision from hysterectomy/ruptured bladder.  She is a little more apple than pear  in shape. Rectal: No mass.  Guaiac neg. Extremities:  Good strength and ROM  in upper and lower extremities. Neurologic:  Grossly intact to motor and sensory function. Psychiatric: Has normal mood and affect. Behavior is normal.   DATA REVIEWED: Data from Dr. Truddie Crumble in [place], [place].  Ovidio Kin, MD,  [facility] [address]  [address]   [place], [place]    [address] Phone:  [phone] FAX:  [phone]

## 2013-01-15 NOTE — Op Note (Signed)
01/15/2013  12:18 PM  PATIENT:  Joy Charles, 62 y.o., female, MRN: [id number]  PREOP DIAGNOSIS:  morbid obesity   POSTOP DIAGNOSIS:   Morbid Obesity, Weight - 215, BMI - 38.2, small hiatal hernia  PROCEDURE:   Laparoscopic adjustable gastric band, AP Standard, repair of hiatal hernia with single posterior stitch of 2-0 Ethibond suture.  SURGEON:   Ovidio Kin, M.D.  ASSISTANT:   Sheron Nightingale, M.D.  ANESTHESIA:   general  Anesthesiologist: Phillips Grout, MD CRNA: Perry Mount, CRNA; Elyn Peers, CRNA  General  EBL:  MInimal  ml  BLOOD ADMINISTERED: none  LOCAL MEDICATIONS USED:   35 cc 1/4% marcaine  SPECIMEN:   None  COUNTS CORRECT:  YES  INDICATIONS FOR PROCEDURE:  Joy Charles is a 62 y.o. (DOB: 15-Dec-1950) white  female whose primary care physician is [name],[name], MD and comes for adjustable laparoscopic gastric band placement.   The indications and risks of the Lap Band surgery were explained to the patient.  The risks include, but are not limited to, infection, bleeding, bowel injury, and failure to lose weight.  Long term risk include, but are not limited to, slippage and erosion of the Lap Band.  OPERATIVE NOTE:  The patient was taken to room #1 at [facility] for the operation. Anesthesiologist: Phillips Grout, MD CRNA: Perry Mount, CRNA; Elyn Peers, CRNA  was the supervising anesthesiologist.  The patient's abdomen was prepped with Chloroprep and sterilely draped.  The patient was given Levaquin at the beginning of the operation.   A time out was held and the surgical checklist reviewed.   The abdomen was accessed in the LUQ with an 5 mm trocar.  Four additional trocars were placed: a 5 mm trocar in the subxiphoid area for the liver retractor, a 15 mm trocar in the right costal margin, a 12 mm trocar to the right of midline, and a 5 mm trocar to the left of midline.   An abdominal exploration was carried out.  The liver was unremarkable.  The  stomach was unremarkable.  The remainder of the bowel, which could be seen, was unremarkable.   A test for a hiatal hernia was done using the sizing balloon.  The sizing tube was advanced into the stomach, 15 cc of air and 10 cc of air was placed in the sizing balloon, and the balloon pulled back to the esophageal hiatus. When there was 10 cc of air in the balloon, there was minimal slippage back of the balloon. The patient had evidence of a small hiatal hernia on pre op UGI. She has GERD.  So I did do a single stitch repair of the hiatus posteriorly.   I opened the gastrohepatic ligament over the caudate lobe of the liver  I identified both the right and left crus behind the esophagus and placed a single 2-0 Ethibond suture with a Tye-Knot through both crus posteriorly.   Then I turned my attention to the lap band part of the procedure.  I made an incision at the Angle of Hiss and identified the left crus.  I identified the right crus and passed the "finger" dissector behind the gastroesophageal junction.   I then used a AP Standard Lap Band and inserted the silastic tubing through the tip of the finger dissector and pulled the tubing around the proximal stomach.  The sizing tube was passed into the stomach and the lap band closed over the sizing tube.  The sizing tube was  removed.   I then imbricated the lateral stomach over the lap band and sewed it to the stomach proximal to the lap band.  I used 2-0 Ethibond sutures secured with Tye-Knots to create the gastro-gastric imbrication over the lateral Lap Band.  I placed 3 sutures. Photos were taken and placed in the chart.  The lap band and tubing lay in good position.  The tubing was brought through the right lateral trocar site and attached to the lap band reservoir.  I had marked her abdomen before surgery because she has a high belt line.    The reservoir has a piece of polypropylene mesh sewn on the back to fixate it in the subcutaneous tissues.   I  infiltrated Marcaine into each incision for a total of 35 cc.   Each incision was sewn with 5-0 Monocryl sutures and the skin was painted with Dermabond.  The patient tolerated the procedure well and was sent to the recovery room in good condition.  The sponge and needle count were correct at the end of the case.  Ovidio Kin, MD, [facility] Pager: [phone] Office phone:  [phone]

## 2013-01-15 NOTE — Interval H&P Note (Signed)
History and Physical Interval Note:  01/15/2013 9:57 AM  Joy Charles  has presented today for surgery, with the diagnosis of morbid obesity   The various methods of treatment have been discussed with the patient and family.  Husband is with patient.  After consideration of risks, benefits and other options for treatment, the patient has consented to  Procedure(s): LAPAROSCOPIC GASTRIC BANDING WITH possible  HIATAL HERNIA REPAIR (N/A) as a surgical intervention .    The patient's history has been reviewed, patient examined, no change in status, stable for surgery.  I have reviewed the patient's chart and labs.  Questions were answered to the patient's satisfaction.     Coty Larsh H

## 2013-01-15 NOTE — Transfer of Care (Signed)
Immediate Anesthesia Transfer of Care Note  Patient: Joy Charles  Procedure(s) Performed: Procedure(s): LAPAROSCOPIC GASTRIC BANDING,  HIATAL HERNIA REPAIR (N/A) MESH APPLIED TO LAP PORT (N/A)  Patient Location: PACU  Anesthesia Type:General  Level of Consciousness: awake, alert  and oriented  Airway & Oxygen Therapy: Patient Spontanous Breathing and Patient connected to face mask oxygen  Post-op Assessment: Report given to PACU RN and Post -op Vital signs reviewed and stable  Post vital signs: Reviewed and stable  Complications: No apparent anesthesia complications

## 2013-01-15 NOTE — Preoperative (Signed)
Beta Blockers   Reason not to administer Beta Blockers:Not Applicable 

## 2013-01-15 NOTE — Anesthesia Postprocedure Evaluation (Signed)
  Anesthesia Post-op Note  Patient: Joy Charles  Procedure(s) Performed: Procedure(s) (LRB): LAPAROSCOPIC GASTRIC BANDING,  HIATAL HERNIA REPAIR (N/A) MESH APPLIED TO LAP PORT (N/A)  Patient Location: PACU  Anesthesia Type: General  Level of Consciousness: awake and alert   Airway and Oxygen Therapy: Patient Spontanous Breathing  Post-op Pain: mild  Post-op Assessment: Post-op Vital signs reviewed, Patient's Cardiovascular Status Stable, Respiratory Function Stable, Patent Airway and No signs of Nausea or vomiting  Last Vitals:  Filed Vitals:   01/15/13 1230  BP: 114/62  Pulse: 93  Temp:   Resp: 17    Post-op Vital Signs: stable   Complications: No apparent anesthesia complications

## 2013-01-15 NOTE — Anesthesia Preprocedure Evaluation (Addendum)
Anesthesia Evaluation  Patient identified by MRN, date of birth, ID band Patient awake    Reviewed: Allergy & Precautions, H&P , NPO status , Patient's Chart, lab work & pertinent test results  Airway Mallampati: II TM Distance: >3 FB Neck ROM: Full    Dental no notable dental hx.    Pulmonary neg pulmonary ROS,  breath sounds clear to auscultation  Pulmonary exam normal       Cardiovascular hypertension, Pt. on medications Rhythm:Regular Rate:Normal     Neuro/Psych negative neurological ROS  negative psych ROS   GI/Hepatic negative GI ROS, Neg liver ROS,   Endo/Other  Morbid obesity  Renal/GU negative Renal ROS  negative genitourinary   Musculoskeletal negative musculoskeletal ROS (+)   Abdominal   Peds negative pediatric ROS (+)  Hematology negative hematology ROS (+)   Anesthesia Other Findings   Reproductive/Obstetrics negative OB ROS                           Anesthesia Physical Anesthesia Plan  ASA: III  Anesthesia Plan: General   Post-op Pain Management:    Induction: Intravenous  Airway Management Planned: Oral ETT  Additional Equipment:   Intra-op Plan:   Post-operative Plan: Extubation in OR  Informed Consent: I have reviewed the patients History and Physical, chart, labs and discussed the procedure including the risks, benefits and alternatives for the proposed anesthesia with the patient or authorized representative who has indicated his/her understanding and acceptance.   Dental advisory given  Plan Discussed with: CRNA  Anesthesia Plan Comments:         Anesthesia Quick Evaluation  

## 2013-01-16 ENCOUNTER — Encounter (HOSPITAL_COMMUNITY): Payer: Self-pay | Admitting: Surgery

## 2013-01-16 MED ORDER — OXYCODONE HCL 5 MG/5ML PO SOLN: 5.0000 mg | Freq: Four times a day (QID) | ORAL | Status: DC | PRN

## 2013-01-16 NOTE — Discharge Summary (Signed)
Physician Discharge Summary  Patient ID:  Joy Charles  MRN: [id number]  DOB/AGE: [birth year] 62 y.o.  Admit date: 01/15/2013 Discharge date: 01/16/2013  Discharge Diagnoses:  1.  Morbid obesity 2.  Hypertension  3.  GERD   She has a small HH on UGI 4. Gallstones - asymptomatic  Operation: Procedure(s): LAPAROSCOPIC GASTRIC BANDING,  HIATAL HERNIA REPAIR, and MESH APPLIED TO LAP PORT on 01/15/2013 - D. Ezzard Standing  Discharged Condition: good  Hospital Course: Joy Charles is an 62 y.o. female whose primary care physician is [name],[name], MD and who was admitted 01/15/2013 with a chief complaint of morbid obesity.   She was brought to the operating room on 01/15/2013 and underwent  LAPAROSCOPIC GASTRIC BANDING,  HIATAL HERNIA REPAIR, and MESH APPLIED TO LAP PORT.   She is now one day post op.  She has done well.  Her KUB shows the lap band in good position.  She is ready for discharge.  Her husband is in the room.  The discharge instructions were reviewed with the patient.  Consults: None  Significant Diagnostic Studies: Results for orders placed during the hospital encounter of 01/10/13  CBC WITH DIFFERENTIAL      Result Value Range   WBC 7.6  4.0 - 10.5 K/uL   RBC 4.84  3.87 - 5.11 MIL/uL   Hemoglobin 15.6 (*) 12.0 - 15.0 g/dL   HCT 40.9  81.1 - 91.4 %   MCV 92.1  78.0 - 100.0 fL   MCH 32.2  26.0 - 34.0 pg   MCHC 35.0  30.0 - 36.0 g/dL   RDW 78.2  95.6 - 21.3 %   Platelets 287  150 - 400 K/uL   Neutrophils Relative % 67  43 - 77 %   Neutro Abs 5.1  1.7 - 7.7 K/uL   Lymphocytes Relative 24  12 - 46 %   Lymphs Abs 1.8  0.7 - 4.0 K/uL   Monocytes Relative 8  3 - 12 %   Monocytes Absolute 0.6  0.1 - 1.0 K/uL   Eosinophils Relative 1  0 - 5 %   Eosinophils Absolute 0.1  0.0 - 0.7 K/uL   Basophils Relative 0  0 - 1 %   Basophils Absolute 0.0  0.0 - 0.1 K/uL  COMPREHENSIVE METABOLIC PANEL      Result Value Range   Sodium 139  135 - 145 mEq/L   Potassium 4.6  3.5 -  5.1 mEq/L   Chloride 100  96 - 112 mEq/L   CO2 28  19 - 32 mEq/L   Glucose, Bld 107 (*) 70 - 99 mg/dL   BUN 24 (*) 6 - 23 mg/dL   Creatinine, Ser 0.86  0.50 - 1.10 mg/dL   Calcium 57.8  8.4 - 46.9 mg/dL   Total Protein 8.0  6.0 - 8.3 g/dL   Albumin 4.8  3.5 - 5.2 g/dL   AST 42 (*) 0 - 37 U/L   ALT 42 (*) 0 - 35 U/L   Alkaline Phosphatase 89  39 - 117 U/L   Total Bilirubin 0.8  0.3 - 1.2 mg/dL   GFR calc non Af Amer 78 (*) >90 mL/min   GFR calc Af Amer >90  >90 mL/min   Dg Abd 1 View  01/15/2013   CLINICAL DATA:  Lap band placement  EXAM: ABDOMEN - 1 VIEW  COMPARISON:  Preoperative upper GI of 07/25/2012  FINDINGS: A lap band has been placed and it is horizontal extending  from the 3 o'clock and 9 o'clock position. The catheter tubing from the port in the right abdomen to the lap band appears intact. The bowel gas pattern is nonspecific.  IMPRESSION: The lap band is horizontal in position as noted above. The bowel gas pattern is nonspecific   Electronically Signed   By: Dwyane Dee M.D.   On: 01/15/2013 16:39   Discharge Exam:  Filed Vitals:   01/16/13 0601  BP: 117/75  Pulse: 93  Temp: 98.7 F (37.1 C)  Resp: 18    General: WN obese WF who is alert and generally healthy appearing.  Lungs: Clear to auscultation and symmetric breath sounds. Heart:  RRR. No murmur or rub. Abdomen: Soft. No hernia. Normal bowel sounds. Incisions look good.  Discharge Medications:     Medication List    ASK your doctor about these medications       amLODipine 10 MG tablet  Commonly known as:  NORVASC  Take 10 mg by mouth every morning.     BLACK COHOSH PO  Take 40 mg by mouth daily.     fish oil-omega-3 fatty acids 1000 MG capsule  Take 1 g by mouth daily.     losartan 25 MG tablet  Commonly known as:  COZAAR  Take 25 mg by mouth every morning.     MULTIVITAL tablet  Take 1 tablet by mouth daily.        Disposition: 01-Home or Self Care       Future Appointments Provider  Department Dept Phone   01/24/2013 9:00 AM Kandis Cocking, MD [facility], [place] [phone]     Return to work on:  2 weeks  Activity:  Driving - May drive in a couple of days, if doing well.   Lifting - No lifting > 15 pounds for 1 week, then no limit  Wound Care:   May shower starting tomorrow  Diet:  Post lap band diet  Follow up appointment:  Call Dr. Allene Pyo office [facility]) at [phone] for an appointment in 2 weeks.  Medications and dosages:  Resume your home medications.  You have a prescription for:  Oxycodone elixir  Signed: Ovidio Kin, M.D., [facility] Office:  [phone]  01/16/2013, 7:07 AM

## 2013-01-16 NOTE — Progress Notes (Signed)
Patient is alert and oriented.  Pain is controlled, and patient is tolerating fluids.  Plan to advance to protein shake today.  Reviewed Adjustable gastric band discharge instructions with patient, patient able to articulate understanding.    ADJUSTABLE GASTRIC BAND  Home Care Instructions  These instructions are to help you care for yourself when you go home.  Call: If you have any problems.   Call [phone] and ask for the surgeon on call   If you need immediate assistance come to the ER at [facility]. Tell the ER staff that you are a new post-op gastric banding patient   Signs and symptoms to report:   Severe vomiting or nausea o If you cannot handle clear liquids for longer than 1 day, call your surgeon    Abdominal pain which does not get better after taking your pain medication   Fever greater than 100.4 F and chills   Heart rate over 100 beats a minute   Trouble breathing   Chest pain    Redness, swelling, drainage, or foul odor at incision (surgical) sites    If your incisions open or pull apart   Swelling or pain in calf (lower leg)   Diarrhea (Loose bowel movements that happen often), frequent watery, uncontrolled bowel movements   Constipation, (no bowel movements for 3 days) if this happens:  o Take Milk of Magnesia, 2 tablespoons by mouth, 3 times a day for 2 days if needed o Stop taking Milk of Magnesia once you have had a bowel movement o Call your doctor if constipation continues Or o Take Miralax  (instead of Milk of Magnesia) following the label instructions o Stop taking Miralax once you have had a bowel movement o Call your doctor if constipation continues   Anything you think is "abnormal for you"   Normal side effects after surgery:   Unable to sleep at night or unable to concentrate   Irritability   Being tearful (crying) or depressed These are common complaints, possibly related to your anesthesia, stress of surgery, and change in lifestyle, that  usually go away a few weeks after surgery.  If these feelings continue, call your medical doctor.   Wound Care: You may have surgical glue, steri-strips, or staples over your incisions after surgery   Surgical glue:  Looks like a clear film over your incisions and will wear off a little at a time   Steri-strips : Adhesive strips of tape over your incisions. You may notice a yellowish color on the skin under the steri-strips. This is used to make the   steri-strips stick better. Do not pull the steri-strips off - let them fall off   Staples: Staples may be removed before you leave the hospital o If you go home with staples, call [facility] at for an appointment with your surgeon's nurse to have staples removed 10 days after surgery, [phone]   Showering: You may shower two (2) days after your surgery unless your surgeon tells you differently o Wash gently around incisions with warm soapy water, rinse well, and gently pat dry  o If you have a drain (tube from your incision), you may need someone to hold this while you shower  o No tub baths until staples are removed and incisions are healed     Medications:   Medications should be liquid or crushed if larger than the size of a dime   Extended release pills (medication that releases a little bit at  a time through the day) should not be crushed   Depending on the size and number of medications you take, you may need to space (take a few throughout the day)/change the time you take your medications so that you do not over-fill your pouch (smaller stomach)   Make sure you follow-up with your primary care physician to make medication changes needed during rapid weight loss and life-style changes   If you have diabetes, follow up with the doctor that orders your diabetes medication(s) within one week after surgery and check your blood sugar regularly.   Do not drive while taking narcotics (pain medications)   Do not take acetaminophen  (Tylenol) and Roxicet or Lortab Elixir at the same time since these pain medications contain acetaminophen  Diet:                    First 2 Weeks  You will see the nutritionist about two (2) weeks after your surgery. The nutritionist will increase the types of foods you can eat if you are handling liquids well:   If you have severe vomiting or nausea and cannot handle clear liquids lasting longer than 1 day, call your surgeon  For Same Day Surgery Discharge Patients:    The day of surgery drink water only: 2 ounces every 4 hours   If you are handling water, start drinking your high protein shake the next morning For Overnight Stay Patients:    Begin by drinking 2 ounces of a high protein every 3 hours, 5 - 6 times per day   Slowly increase the amount you drink as tolerated   You may find it easier to slowly sip shakes throughout the day.  It is important to get your proteins in first Protein Shake   Drink at least 2 ounces of shake 5-6 times per day   Each serving of protein shakes (usually 8 - 12 ounces) should have a minimum of:  o 15 grams of protein  o And no more than 5 grams of carbohydrate    Goal for protein each day: o Men = 80 grams per day o Women = 60 grams per day   Protein powder may be added to fluids such as non-fat milk or Lactaid milk or Soy milk (limit to 35 grams added protein powder per serving)  Hydration   Slowly increase the amount of water and other clear liquids as tolerated (See Acceptable Fluids)   Slowly increase the amount of protein shake as tolerated     Sip fluids slowly and throughout the day   May use sugar substitutes in small amounts (no more than 6 - 8 packets per day; i.e. Splenda)  Fluid Goal   The first goal is to drink at least 8 ounces of protein shake/drink per day (or as directed by the nutritionist); some examples of protein shakes are [employer], [employer], EAS Edge HP, and Unjury. See handout from pre-op Bariatric Education  Class: o Slowly increase the amount of protein shake you drink as tolerated o You may find it easier to slowly sip shakes throughout the day o It is important to get your proteins in first   Your fluid goal is to drink 64 - 100 ounces of fluid daily o It may take a few weeks to build up to this   32 oz (or more) should be clear liquids  And    32 oz (or more) should be full liquids (see below for  examples)   Liquids should not contain sugar, caffeine, or carbonation  Clear Liquids:   Water or Sugar-free flavored water (i.e. Fruit H2O, Propel)   Decaffeinated coffee or tea (sugar-free)   Crystal Lite, Wyler's Lite, Minute Maid Lite   Sugar-free Jell-O   Bouillon or broth   Sugar-free Popsicle:   *Less than 20 calories each; Limit 1 per day            Full Liquids: Protein Shakes/Drinks + 2 choices per day of other full liquids   Full liquids must be: o No More Than 12 grams of Carbs per serving  o No More Than 3 grams of Fat per serving   Strained low-fat cream soup   Non-Fat milk   Fat-free Lactaid Milk   Sugar-free yogurt (Dannon Lite & Fit, Greek yogurt)   Vitamins and Minerals   Start 1 day after surgery unless otherwise directed by your surgeon   1 Chewable Multivitamin / Multimineral Supplement with iron (i.e. Centrum for Adults)   Chewable Calcium Citrate with Vitamin D-3 (Example: 3 Chewable Calcium Plus 600 with Vitamin D-3) o Take 500 mg three (3) times a day for a total of 1500 mg each day o Do not take all 3 doses of calcium at one time as it may cause constipation, and you can only absorb 500 mg  at a time  o Do not mix multivitamins containing iron with calcium supplements; take 2 hours apart o Do not substitute Tums (calcium carbonate) for your calcium   Menstruating women and those at risk for anemia (a blood disease that causes weakness) may need extra iron o Talk with your doctor to see if you need more iron   If you need extra iron: Total daily Iron  recommendation (including Vitamins) is 50 to 100 mg Iron/day   Do not stop taking or change any vitamins or minerals until you talk to your nutritionist or surgeon   Your nutritionist and/or surgeon must approve all vitamin and mineral supplements  Activity and Exercise: It is important to continue walking at home.  Limit your physical activity as instructed by your doctor.  During this time, use these guidelines:   Do not lift anything greater than ten (10) pounds for at least two (2) weeks   Do not go back to work or drive until Designer, industrial/product says you can   You may have sex when you feel comfortable  o It is VERY important for female patients to use a reliable birth control method; fertility often increases after surgery  o Do not get pregnant for at least 18 months   Start exercising as soon as your doctor tells you that you can o Make sure your doctor approves any physical activity   Start with a simple walking program   Walk 5-15 minutes each day, 7 days per week.    Slowly increase until you are walking 30-45 minutes per day   Consider joining our BELT program. [phone] or email [email or web address]@[employer] .[email or web address]   Special Instructions Things to remember:   Free counseling is available for you and your family through collaboration between [facility] and [place]. Please call [phone] and leave a message   Use your CPAP when sleeping if this applies to you    Consider buying a medical alert bracelet that says you had lap-band surgery    You will likely have your first fill (fluid added to your band) 6 - 8 weeks after surgery   [facility] has a free Bariatric Surgery Support Group that meets monthly, the 3rd Thursday, 6 pm, [facility] Classrooms You can see classes online at [email or web address]   It is very important to keep all follow up appointments with your surgeon, nutritionist, primary care physician, and behavioral health practitioner o After the first year,  please follow up with your bariatric surgeon and nutritionist at least once a year in order to maintain best weight loss results [facility]: [phone] [facility]: [phone] Bariatric Nurse Coordinator: [phone]

## 2013-01-18 ENCOUNTER — Encounter (INDEPENDENT_AMBULATORY_CARE_PROVIDER_SITE_OTHER): Payer: 59 | Admitting: Surgery

## 2013-01-24 ENCOUNTER — Ambulatory Visit (INDEPENDENT_AMBULATORY_CARE_PROVIDER_SITE_OTHER): Payer: 59 | Admitting: Surgery

## 2013-01-24 ENCOUNTER — Encounter (INDEPENDENT_AMBULATORY_CARE_PROVIDER_SITE_OTHER): Payer: Self-pay | Admitting: Surgery

## 2013-01-24 ENCOUNTER — Encounter: Payer: 59 | Attending: Surgery | Admitting: Dietician

## 2013-01-24 DIAGNOSIS — Z713 Dietary counseling and surveillance: Secondary | ICD-10-CM | POA: Insufficient documentation

## 2013-01-24 DIAGNOSIS — Z9884 Bariatric surgery status: Secondary | ICD-10-CM | POA: Insufficient documentation

## 2013-01-24 NOTE — Patient Instructions (Signed)
Goals:  Follow Phase 3A: Soft High Protein Phase  Eat 3-6 small meals/snacks, every 3-5 hrs  Increase lean protein foods to meet 60g goal  Increase fluid intake to 64oz +  Avoid drinking 15 minutes before, during and 30 minutes after eating  Aim for >30 min of physical activity daily per MD 

## 2013-01-24 NOTE — Progress Notes (Signed)
Bariatric Class:  Appt start time: 0800 end time:  0815.  2 Week Post-Operative Nutrition Class  Patient was seen on 01/24/13 for Post-Operative Nutrition education at [facility].   Surgery date: 01/15/13 Surgery type: LAGB  Starting Weight at [facility]: 207 lbs Weight today: 203.0 lbs Weight change: 4 lbs Total weight lost: 4 lbs  TANITA  BODY COMP RESULTS  01/24/13   BMI (kg/m^2) 36.0   Fat Mass (lbs) 101.0   Fat Free Mass (lbs) 102.0   Total Body Water (lbs) 74.5   The following the learning objectives were met by the patient during this course:  Identifies Phase 3A (Soft, High Proteins) Dietary Goals and will begin from 2 weeks post-operatively to 2 months post-operatively  Identifies appropriate sources of fluids and proteins   States protein recommendations and appropriate sources post-operatively  Identifies the need for appropriate texture modifications, mastication, and bite sizes when consuming solids  Identifies appropriate multivitamin and calcium sources post-operatively  Describes the need for physical activity post-operatively and will follow MD recommendations  States when to call healthcare provider regarding medication questions or post-operative complications  Handouts given during class include:  Phase 3A: Soft, High Protein Diet Handout  Follow-Up Plan: Patient will follow-up at [facility] in 4 weeks for 6 week post-op nutrition visit for diet advancement per MD.

## 2013-01-24 NOTE — Progress Notes (Signed)
Re:   Joy Charles DOB:   Jan 07, 1951 MRN:   [id number]  ASSESSMENT AND PLAN: 1.  Lap band, APS, 01/15/2013 - D.  Soham Charles  Morbid obesity,  Initial weight - 215, BMI - 38   Doing well.  I wrote her a note she can return to work tomorrow, 01/25/2013.  Return to see me in 6 weeks.  2.  Hypertension 3.  GERD  Small HH - repaired at the time of the lap band placement 4.  Gallstones  Her mother and GM had gall stones.  I gave her a book on gall bladder disease.  Chief Complaint  Patient presents with  . Bariatric Follow Up   REFERRING PHYSICIAN: [name],[name], Charles  HISTORY OF PRESENT ILLNESS: Joy Charles is a 62 y.o. (DOB: 03-13-1951)  white  female whose primary care physician is Joy Charles Joy Charles) and comes for follow up of lap band surgery.   She is with her husband. She has done well.  She met with the nutritionist this AM and they have advanced her to protein.  She is feeling some resistance, so I told her to go slowly, which she knows. I wrote her a note she can return to work tomorrow, 01/25/2013.  History of obesity (11/2012): She has been overweight much of her adult life, particularly since she has had her kids.  She did one time diet down to 147 on Atkin's many years ago.  The patient's husband, Joy Charles, is a LAP-BAND patient of mine. I did his LAP-BAND on 11/03/2009 and he has been successful with weight loss. She has been to one of our information sessions. She has tried multiple diets including Weight Watchers, Atkins, Slim fast, and the diet in which she did not eat after 2 PM. She is currently 4 of 6 months into a physicians supervised weight loss plan.  At this time, she is undecided about what bariatrics surgery she wants to pursue.  Her husband had the Lap Band.  They have a friend, Joy Charles ([id number]) who is a RYGB that I did.  They also have heard about the gastric sleeve. UGI - 07/25/2012 - small hiatal hernia. [place] - 07/15/2012 -  gallstones.   Past Medical History  Diagnosis Date  . Hypertension   . Obesity      Current Outpatient Prescriptions  Medication Sig Dispense Refill  . amLODipine (NORVASC) 10 MG tablet Take 10 mg by mouth every morning.       Marland Kitchen BLACK COHOSH PO Take 40 mg by mouth daily.       . fish oil-omega-3 fatty acids 1000 MG capsule Take 1 g by mouth daily.       Marland Kitchen losartan (COZAAR) 25 MG tablet Take 25 mg by mouth every morning.       . Multiple Vitamins-Minerals (MULTIVITAL) tablet Take 1 tablet by mouth daily.      Marland Kitchen oxyCODONE (ROXICODONE) 5 MG/5ML solution Take 5 mLs (5 mg total) by mouth every 6 (six) hours as needed for pain.  200 mL  0   No current facility-administered medications for this visit.    Allergies  Allergen Reactions  . Cephalosporins Hives and Rash  . Sulfa Antibiotics Hives and Itching  . Sulfur Rash   REVIEW OF SYSTEMS: Cardiac:  Hypertension x 10 years.  History of "phlebitis", but not DVT. Gastrointestinal:  GERD, but doing well now and not on meds.  No history of liver disease.  No history of gall bladder disease.  No history of pancreas disease.  No history of colon disease.  Colonoscopy at [facility] at age 67. Urologic:  No history of kidney stones.  No history of bladder infections. GYN:  History of hysterectomy and ruptured bladder - 1984.  SOCIAL and FAMILY HISTORY: Joy Charles ([id number]), is lap band patient of mine. They have two boys, 33 and 34. 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  PHYSICAL EXAM: BP 120/82  Pulse 70  Temp(Src) 98.1 F (36.7 C) (Temporal)  Resp 14  Ht 5\' 3"  (1.6 m)  Wt 202 lb 6.4 oz (91.808 kg)  BMI 35.86 kg/m2  General: Obese WN WF who is alert and generally healthy appearing.  Abdomen: Soft. No mass. Incisions look good.    DATA REVIEWED: None new.  Ovidio Kin, Charles,  [facility] [address] [place]  [address]   [place], [place]     [address] Phone:  [phone] FAX:  [phone]

## 2013-03-06 ENCOUNTER — Ambulatory Visit (INDEPENDENT_AMBULATORY_CARE_PROVIDER_SITE_OTHER): Payer: 59 | Admitting: Surgery

## 2013-03-06 ENCOUNTER — Encounter (INDEPENDENT_AMBULATORY_CARE_PROVIDER_SITE_OTHER): Payer: Self-pay | Admitting: Surgery

## 2013-03-06 ENCOUNTER — Encounter: Payer: 59 | Attending: Surgery | Admitting: Dietician

## 2013-03-06 VITALS — BP 126/82 | HR 72 | Temp 98.9°F | Resp 14 | Ht 63.0 in | Wt 195.4 lb

## 2013-03-06 DIAGNOSIS — Z713 Dietary counseling and surveillance: Secondary | ICD-10-CM | POA: Insufficient documentation

## 2013-03-06 DIAGNOSIS — Z9884 Bariatric surgery status: Secondary | ICD-10-CM

## 2013-03-06 NOTE — Progress Notes (Signed)
  Follow-up visit:  6 Weeks Post-Operative LAGB Surgery  Medical Nutrition Therapy:  Appt start time: 0915 end time:  0945.  Primary concerns today: Post-operative Bariatric Surgery Nutrition Management. Joy Charles is here today for a f/u for   Surgery date: 01/15/13 Surgery type: LAGB  Starting Weight at [facility]: 207 lbs Weight today: 196 lbs Weight change: 7 lbs, 12 lbs fat loss Total weight lost:11 lbs  TANITA  BODY COMP RESULTS  01/24/13 03/06/13   BMI (kg/m^2) 36.0 34.7   Fat Mass (lbs) 101.0 89.0   Fat Free Mass (lbs) 102.0 107.0   Total Body Water (lbs) 74.5 78.5    Preferred Learning Style:   Auditory  Visual  Learning Readiness:   Ready  24-hr recall: B (AM): Premier Shake (30 g protein) Snk (AM): egg  (6 g protein) L (PM): 4 oz chicken and green beans (28 g protein) Snk (PM): not usually  D (PM): 4 oz fish, chicken, or pinto with green beans (28 g protein) Snk (PM): not usually  Fluid intake: 64 oz + protein shake and flavored water and decaf tea Estimated total protein intake: 90 g +  Medications: see list Supplementation: Taking  Using straws: No Drinking while eating: No Hair loss: No Carbonated beverages: No N/V/D/C: Constipation, taking Miralax  Last Lap-Band fill: getting first fill today  Recent physical activity:  Walking almost every day for about 20-60 minutes  Progress Towards Goal(s):  In progress.  Handouts given during visit include:  Phase 3 B High Protein + Non Starchy Vegetables   Nutritional Diagnosis:  [name]-3.3 Overweight/obesity related to past poor dietary habits and physical inactivity as evidenced by patient w/ recent LAGB surgery following dietary guidelines for continued weight loss.    Intervention:  Nutrition education/diet advancement.  Teaching Method Utilized:  Visual Auditory  Barriers to learning/adherence to lifestyle change: none  Demonstrated degree of understanding via:  Teach Back   Monitoring/Evaluation:   Dietary intake, exercise, lap band fills, and body weight. Follow up in 6 weeks for 3 month post-op visit.

## 2013-03-06 NOTE — Progress Notes (Addendum)
Re:   Joy Charles DOB:   Apr 07, 1950 MRN:   [id number]  ASSESSMENT AND PLAN: 1.  Lap band, APS, 01/15/2013 - D.  Joy Charles  Morbid obesity,  Initial weight - 215, BMI - 38   Doing well.  I added 2.0 cc for a total of 3.5 cc of fluid in lap band  Return to see me in 8 weeks.  2.  Rash at incision [photo at end of chart]  Unknown cause 2.  Hypertension 3.  GERD  Small HH - repaired at the time of the lap band placement 4.  Gallstones  Her mother and GM had gall stones.  I have given her a book on gall bladder disease.  [I see her husband, Joy Charles (MRN: [id number])]   Chief Complaint  Patient presents with  . Lap Band Fill   REFERRING PHYSICIAN: [name],[name], MD  HISTORY OF PRESENT ILLNESS: Joy Charles is a 62 y.o. (DOB: 12/17/1950)  white  female whose primary care physician is [name],[name], MD Joy Charles) and comes for follow up of lap band surgery.    She has done well.  She met with the nutritionist this AM and they have advanced her to protein.  She is feeling some resistance, so I told her to go slowly, which she knows. I wrote her a note she can return to work tomorrow, 01/25/2013.  History of obesity (11/2012): She has been overweight much of her adult life, particularly since she has had her kids.  She did one time diet down to 147 on Atkin's many years ago.  The patient's husband, Joy Charles, is a LAP-BAND patient of mine. I did his LAP-BAND on 11/03/2009 and he has been successful with weight loss. She has been to one of our information sessions. She has tried multiple diets including Weight Watchers, Atkins, Slim fast, and the diet in which she did not eat after 2 PM. She is currently 4 of 6 months into a physicians supervised weight loss plan.  At this time, she is undecided about what bariatrics surgery she wants to pursue.  Her husband had the Lap Band.  They have a friend, Joy Charles ([phone]) who is a RYGB that I did.  They also have  heard about the gastric sleeve. UGI - 07/25/2012 - small hiatal hernia. [place] - 07/15/2012 - gallstones.   Past Medical History  Diagnosis Date  . Hypertension   . Obesity      Current Outpatient Prescriptions  Medication Sig Dispense Refill  . amLODipine (NORVASC) 10 MG tablet Take 10 mg by mouth every morning.       Marland Kitchen BLACK COHOSH PO Take 40 mg by mouth daily.       . fish oil-omega-3 fatty acids 1000 MG capsule Take 1 g by mouth daily.       Marland Kitchen losartan (COZAAR) 25 MG tablet Take 25 mg by mouth every morning.       . Multiple Vitamins-Minerals (MULTIVITAL) tablet Take 1 tablet by mouth daily.       No current facility-administered medications for this visit.    Allergies  Allergen Reactions  . Cephalosporins Hives and Rash  . Sulfa Antibiotics Hives and Itching  . Sulfur Rash   REVIEW OF SYSTEMS: Cardiac:  Hypertension x 10 years.  History of "phlebitis", but not DVT. Gastrointestinal:  GERD, but doing well now and not on meds.  No history of liver disease.  No history of gall bladder disease.  No history of pancreas  disease.  No history of colon disease.  Colonoscopy at [facility] at age 51. Urologic:  No history of kidney stones.  No history of bladder infections. GYN:  History of hysterectomy and ruptured bladder - 1984.  SOCIAL and FAMILY HISTORY: Joy Charles Joy Charles ([id number]), is lap band patient of mine. They have two boys, 33 and 34. 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  PHYSICAL EXAM: BP 126/82  Pulse 72  Temp(Src) 98.9 F (37.2 C) (Temporal)  Resp 14  Ht 5\' 3"  (1.6 m)  Wt 195 lb 6.4 oz (88.633 kg)  BMI 34.62 kg/m2  General: Obese WN WF who is alert and generally healthy appearing.  Abdomen: Soft. No mass. Incisions look good.      Procedure:  The lap band was accessed.  She had 1.5 cc in the band.  I added 2.0 cc for a total of 3.5 cc.  She tolerated water.  DATA REVIEWED: None new.  Ovidio Kin, MD,  [facility] [address] [place]  [address]   [place], [place]    [address] Phone:  [phone] FAX:  [phone]

## 2013-03-06 NOTE — Patient Instructions (Signed)
Goals:  Follow Phase 3B: High Protein + Non-Starchy Vegetables  Eat 3-6 small meals/snacks, every 3-5 hrs  Increase lean protein foods to meet 60g goal  Increase fluid intake to 64oz +  Avoid drinking 15 minutes before, during and 30 minutes after eating  Aim for >30 min of physical activity daily  

## 2013-05-02 ENCOUNTER — Ambulatory Visit (INDEPENDENT_AMBULATORY_CARE_PROVIDER_SITE_OTHER): Payer: 59 | Admitting: Physician Assistant

## 2013-05-02 ENCOUNTER — Encounter (INDEPENDENT_AMBULATORY_CARE_PROVIDER_SITE_OTHER): Payer: Self-pay

## 2013-05-02 VITALS — BP 124/80 | HR 72 | Temp 98.4°F | Resp 14 | Ht 63.0 in | Wt 194.4 lb

## 2013-05-02 DIAGNOSIS — Z4651 Encounter for fitting and adjustment of gastric lap band: Secondary | ICD-10-CM

## 2013-05-02 NOTE — Progress Notes (Signed)
  HISTORY: Joy Charles is a 63 y.o.female who received an AP-Standard lap-band in October 2014 by Dr. Ezzard StandingNewman. She comes in with steady weight since her last visit. She denies regurgitation or reflux. She was able to eat more than desired around the holidays and gained weight as a result. Since then she's lost the weight but is still concerned about her hunger. She and her husband are both exercising regularly.  VITAL SIGNS: Filed Vitals:   05/02/13 1140  BP: 124/80  Pulse: 72  Temp: 98.4 F (36.9 C)  Resp: 14    PHYSICAL EXAM: Physical exam reveals a very well-appearing 63 y.o.female in no apparent distress Neurologic: Awake, alert, oriented Psych: Bright affect, conversant Respiratory: Breathing even and unlabored. No stridor or wheezing Abdomen: Soft, nontender, nondistended to palpation. Incisions well-healed. No incisional hernias. Port easily palpated. Extremities: Atraumatic, good range of motion.  ASSESMENT: 63 y.o.  female  s/p AP-Standard lap-band.   PLAN: The patient's port was accessed with a 20G Huber needle without difficulty. Clear fluid was aspirated and 1 mL saline was added to the port to give a total predicted volume of 4.5 mL. The patient was able to swallow water without difficulty following the procedure and was instructed to take clear liquids for the next 24-48 hours and advance slowly as tolerated.

## 2013-05-02 NOTE — Patient Instructions (Signed)

## 2013-06-13 ENCOUNTER — Encounter (INDEPENDENT_AMBULATORY_CARE_PROVIDER_SITE_OTHER): Payer: Self-pay

## 2013-06-13 ENCOUNTER — Ambulatory Visit (INDEPENDENT_AMBULATORY_CARE_PROVIDER_SITE_OTHER): Payer: 59 | Admitting: Physician Assistant

## 2013-06-13 VITALS — BP 130/88 | HR 68 | Temp 98.8°F | Resp 16 | Ht 63.0 in | Wt 193.6 lb

## 2013-06-13 DIAGNOSIS — Z4651 Encounter for fitting and adjustment of gastric lap band: Secondary | ICD-10-CM

## 2013-06-13 NOTE — Progress Notes (Signed)
  HISTORY: Joy Charles is a 63 y.o.female who received an AP-Standard lap-band in October 2014 by Dr. Ezzard StandingNewman. She comes in with stable weight since her last visit in February. She has no complaint of regurgitation or reflux. She complains of persistent hunger and larger than desired portion sizes. She is walking 30 minutes a day for at least four days a week.  VITAL SIGNS: Filed Vitals:   06/13/13 0953  BP: 130/88  Pulse: 68  Temp: 98.8 F (37.1 C)  Resp: 16    PHYSICAL EXAM: Physical exam reveals a very well-appearing 63 y.o.female in no apparent distress Neurologic: Awake, alert, oriented Psych: Bright affect, conversant Respiratory: Breathing even and unlabored. No stridor or wheezing Abdomen: Soft, nontender, nondistended to palpation. Incisions well-healed. No incisional hernias. Port easily palpated. Extremities: Atraumatic, good range of motion.  ASSESMENT: 63 y.o.  female  s/p AP-Standard lap-band.   PLAN: The patient's port was accessed with a 20G Huber needle without difficulty. Clear fluid was aspirated and 0.75 mL saline was added to the port to give a total predicted volume of 5.25 mL. The patient was able to swallow water without difficulty following the procedure and was instructed to take clear liquids for the next 24-48 hours and advance slowly as tolerated.

## 2013-06-13 NOTE — Patient Instructions (Signed)

## 2013-06-20 ENCOUNTER — Encounter (INDEPENDENT_AMBULATORY_CARE_PROVIDER_SITE_OTHER): Payer: 59

## 2013-07-08 ENCOUNTER — Encounter (INDEPENDENT_AMBULATORY_CARE_PROVIDER_SITE_OTHER): Payer: Self-pay | Admitting: Surgery

## 2013-07-18 ENCOUNTER — Encounter (INDEPENDENT_AMBULATORY_CARE_PROVIDER_SITE_OTHER): Payer: Self-pay

## 2013-07-18 ENCOUNTER — Ambulatory Visit (INDEPENDENT_AMBULATORY_CARE_PROVIDER_SITE_OTHER): Payer: 59 | Admitting: Physician Assistant

## 2013-07-18 VITALS — BP 128/80 | HR 68 | Temp 97.2°F | Ht 63.0 in | Wt 193.4 lb

## 2013-07-18 DIAGNOSIS — Z4651 Encounter for fitting and adjustment of gastric lap band: Secondary | ICD-10-CM

## 2013-07-18 NOTE — Patient Instructions (Signed)

## 2013-07-18 NOTE — Progress Notes (Signed)
  HISTORY: Joy Charles is a 63 y.o.female who received an AP-Standard lap-band in October 2014 by Dr. Ezzard StandingNewman. She comes in with stable weight since her last visit one month ago. She has lost a total of 22 lbs since surgery. She complains of continued hunger and larger than desired portions. She is disappointed with her progress so far. She's continuing to exercise. She has no complaints of regurgitation or reflux.  VITAL SIGNS: Filed Vitals:   07/18/13 1554  BP: 128/80  Pulse: 68  Temp: 97.2 F (36.2 C)    PHYSICAL EXAM: Physical exam reveals a very well-appearing 63 y.o.female in no apparent distress Neurologic: Awake, alert, oriented Psych: Bright affect, conversant Respiratory: Breathing even and unlabored. No stridor or wheezing Abdomen: Soft, nontender, nondistended to palpation. Incisions well-healed. No incisional hernias. Port easily palpated. Extremities: Atraumatic, good range of motion.  ASSESMENT: 63 y.o.  female  s/p AP-Standard lap-band.   PLAN: The patient's port was accessed with a 20G Huber needle without difficulty. Clear fluid was aspirated and 0.5 mL saline was added to the port to give a total predicted volume of 5.75 mL. We did a dynamic fill today with water. Both 1 ml or 0.75 mL proved to be too much as water went down slowly and caused burping. The patient was able to swallow water without difficulty following the procedure and was instructed to take clear liquids for the next 24-48 hours and advance slowly as tolerated.

## 2013-08-15 ENCOUNTER — Encounter (INDEPENDENT_AMBULATORY_CARE_PROVIDER_SITE_OTHER): Payer: 59

## 2013-12-26 IMAGING — CR DG ABDOMEN 1V
2 series · 2 of 2 positions shown · non-contrast
Comparison: Preoperative upper GI of 07/25/2012

CLINICAL DATA: Lap band placement

EXAM:
ABDOMEN - 1 VIEW

[t abdomen supine (1 of 2)]
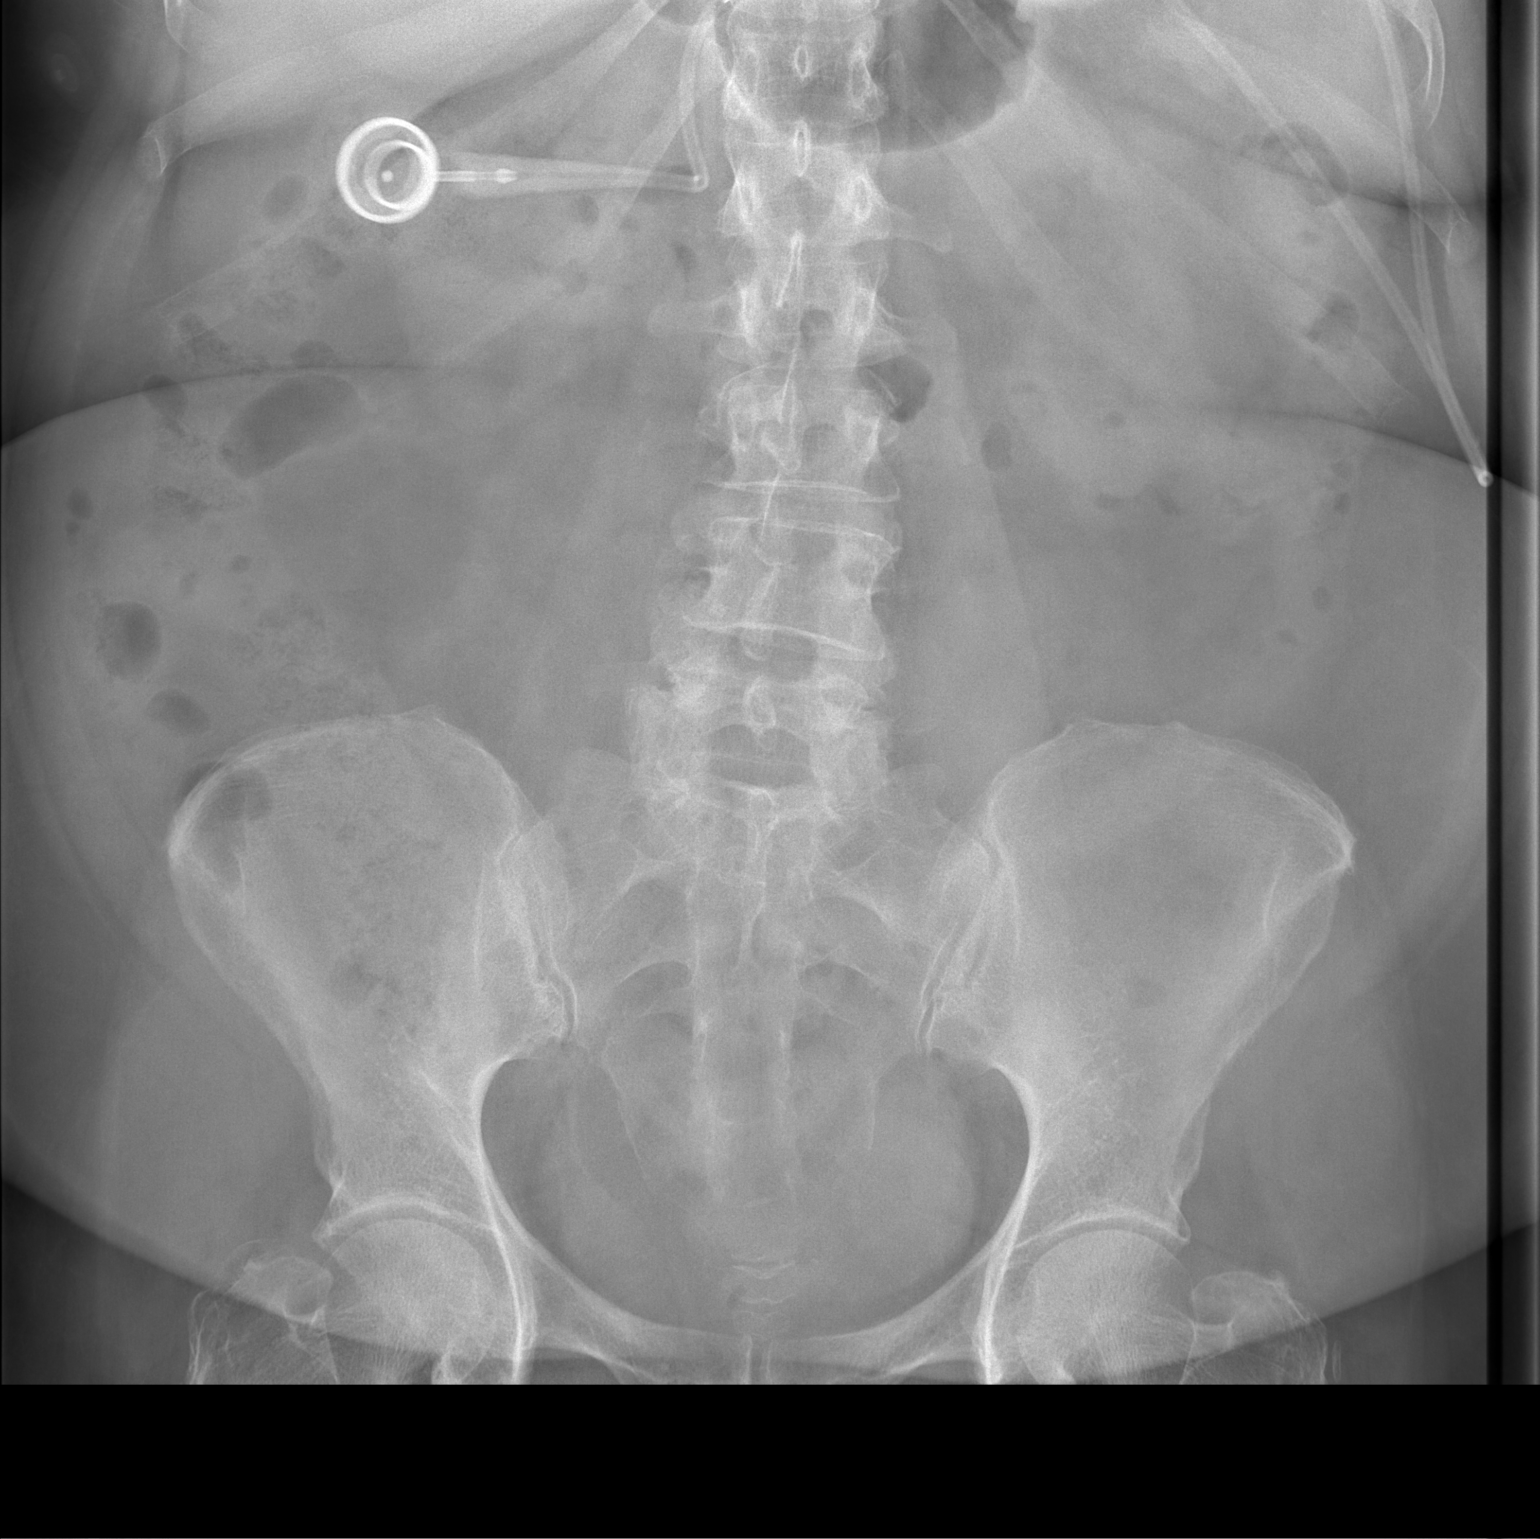

[t abdomen supine (2 of 2)]
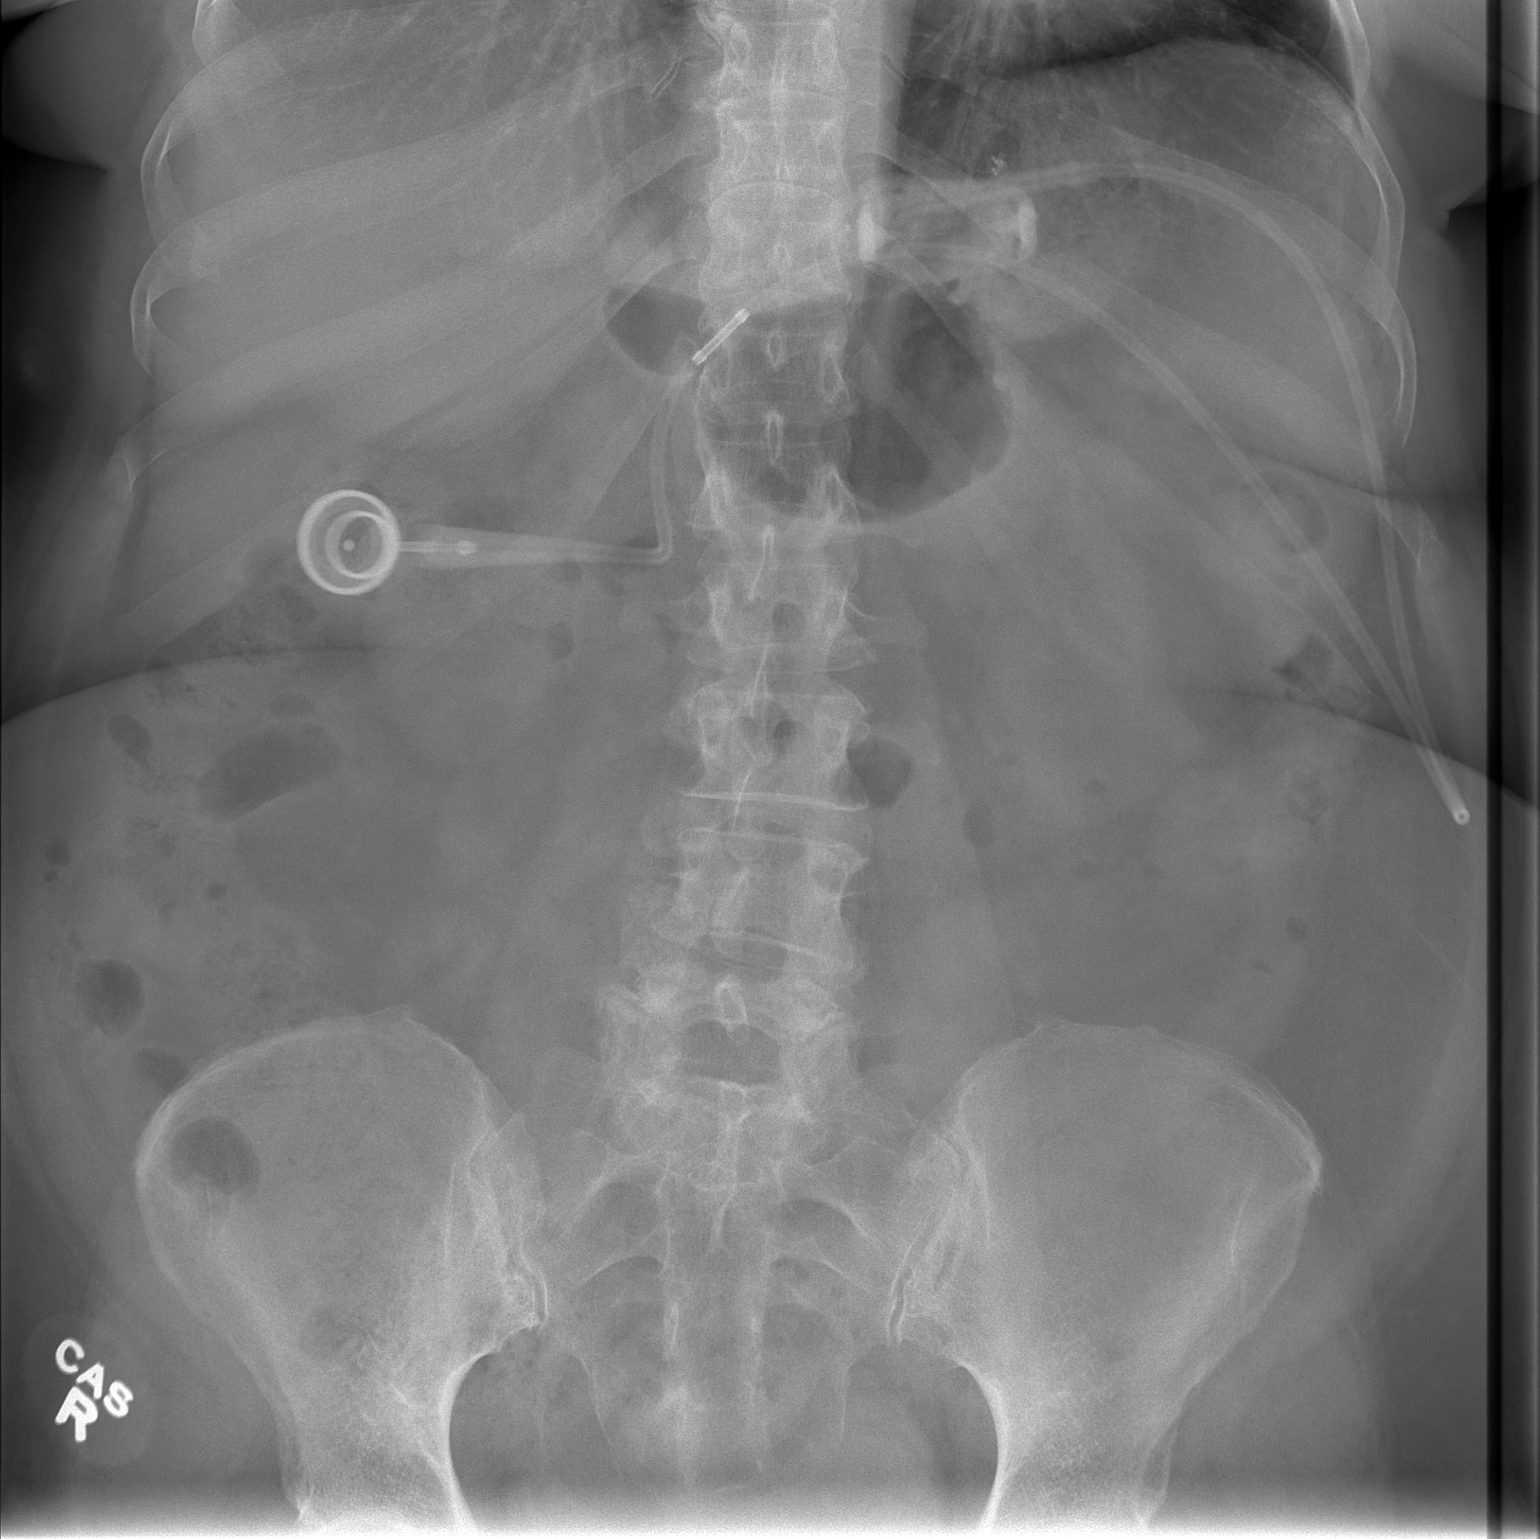

[2 of 2 positions shown; findings below may reference images not displayed]

FINDINGS: A lap band has been placed and it is horizontal extending from the 3
o'clock and 9 o'clock position. The catheter tubing from the port in
the right abdomen to the lap band appears intact. The bowel gas
pattern is nonspecific.
IMPRESSION: The lap band is horizontal in position as noted above. The bowel gas
pattern is nonspecific

## 2016-01-01 ENCOUNTER — Encounter (HOSPITAL_COMMUNITY): Payer: Self-pay

## 2016-01-26 ENCOUNTER — Telehealth (HOSPITAL_COMMUNITY): Payer: Self-pay

## 2016-01-26 NOTE — Telephone Encounter (Signed)
This patient is overdue for recommended follow-up with a bariatric surgeon at [facility]. A letter was mailed to the address on file 10.4.17 from both [place] [facility] & [facility] in attempt to reestablish post-op care. Letter has been returned to [facility] marked undeliverable, unable to forward. No additional address on file in [facility] or [employer]. Information was shared with Frances Jackson today at [facility] so she may contact the patient via phone in attempt to get the patient scheduled for an appointment in their office. °

## 2016-08-15 ENCOUNTER — Encounter (HOSPITAL_COMMUNITY): Payer: Self-pay

## 2020-07-16 ENCOUNTER — Other Ambulatory Visit: Payer: Self-pay | Admitting: Surgery

## 2020-07-16 DIAGNOSIS — K219 Gastro-esophageal reflux disease without esophagitis: Secondary | ICD-10-CM

## 2020-10-02 NOTE — Patient Instructions (Addendum)
DUE TO COVID-19 ONLY ONE VISITOR IS ALLOWED TO COME WITH YOU AND STAY IN THE WAITING ROOM ONLY DURING PRE OP AND PROCEDURE DAY OF SURGERY. THE 1 VISITOR  MAY VISIT WITH YOU AFTER SURGERY IN YOUR PRIVATE ROOM DURING VISITING HOURS ONLY!  YOU NEED TO HAVE A COVID 19 TEST ON: 10:50 AM @ 10:50 AM, THIS TEST MUST BE DONE BEFORE SURGERY,  COVID TESTING SITE [address] [place] [address], IT IS ON THE RIGHT GOING OUT [address] APPROXIMATELY  2 MINUTES PAST [employer] ON THE RIGHT. ONCE YOUR COVID TEST IS COMPLETED,  PLEASE BEGIN THE QUARANTINE INSTRUCTIONS AS OUTLINED IN YOUR HANDOUT.               Joy Charles   Your procedure is scheduled on: 10/06/20   Report to [facility]  Entrance   Report to admitting at: 11:30 AM     Call this number if you have problems the morning of surgery [phone]    Remember: Do not eat solid food :After Midnight. Clear liquids until: 10:30 AM.  CLEAR LIQUID DIET  Foods Allowed                                                                     Foods Excluded  Coffee and tea, regular and decaf                             liquids that you cannot  Plain Jell-O any favor except red or purple                                           see through such as: Fruit ices (not with fruit pulp)                                     milk, soups, orange juice  Iced Popsicles                                    All solid food Carbonated beverages, regular and diet                                    Cranberry, grape and apple juices Sports drinks like Gatorade Lightly seasoned clear broth or consume(fat free) Sugar, honey syrup  Sample Menu Breakfast                                Lunch                                     Supper Cranberry juice  Beef broth                            Chicken broth Jell-O                                     Grape juice                           Apple juice Coffee or tea                         Jell-O                                      Popsicle                                                Coffee or tea                        Coffee or tea  _____________________________________________________________________  BRUSH YOUR TEETH MORNING OF SURGERY AND RINSE YOUR MOUTH OUT, NO CHEWING GUM CANDY OR MINTS.    Take these medicines the morning of surgery with A SIP OF WATER: amlodipine,omeprazole.                               You may not have any metal on your body including hair pins and              piercings  Do not wear jewelry, make-up, lotions, powders or perfumes, deodorant             Do not wear nail polish on your fingernails.  Do not shave  48 hours prior to surgery.    Do not bring valuables to the hospital. [facility] IS NOT             RESPONSIBLE   FOR VALUABLES.  Contacts, dentures or bridgework may not be worn into surgery.  Leave suitcase in the car. After surgery it may be brought to your room.     Patients discharged the day of surgery will not be allowed to drive home. IF YOU ARE HAVING SURGERY AND GOING HOME THE SAME DAY, YOU MUST HAVE AN ADULT TO DRIVE YOU HOME AND BE WITH YOU FOR 24 HOURS. YOU MAY GO HOME BY TAXI OR UBER OR ORTHERWISE, BUT AN ADULT MUST ACCOMPANY YOU HOME AND STAY WITH YOU FOR 24 HOURS.  Name and phone number of your driver:  Special Instructions: N/A              Please read over the following fact sheets you were given: _____________________________________________________________________           [facility] - Preparing for Surgery Before surgery, you can play an important role.  Because skin is not sterile, your skin needs to be as free of germs as possible.  You can reduce the number of germs on your skin by washing with CHG (chlorahexidine gluconate) soap before surgery.  CHG is an  antiseptic cleaner which kills germs and bonds with the skin to continue killing germs even after washing. Please DO NOT use if you have an  allergy to CHG or antibacterial soaps.  If your skin becomes reddened/irritated stop using the CHG and inform your nurse when you arrive at Short Stay. Do not shave (including legs and underarms) for at least 48 hours prior to the first CHG shower.  You may shave your face/neck. Please follow these instructions carefully:  1.  Shower with CHG Soap the night before surgery and the  morning of Surgery.  2.  If you choose to wash your hair, wash your hair first as usual with your  normal  shampoo.  3.  After you shampoo, rinse your hair and body thoroughly to remove the  shampoo.                           4.  Use CHG as you would any other liquid soap.  You can apply chg directly  to the skin and wash                       Gently with a scrungie or clean washcloth.  5.  Apply the CHG Soap to your body ONLY FROM THE NECK DOWN.   Do not use on face/ open                           Wound or open sores. Avoid contact with eyes, ears mouth and genitals (private parts).                       Wash face,  Genitals (private parts) with your normal soap.             6.  Wash thoroughly, paying special attention to the area where your surgery  will be performed.  7.  Thoroughly rinse your body with warm water from the neck down.  8.  DO NOT shower/wash with your normal soap after using and rinsing off  the CHG Soap.                9.  Pat yourself dry with a clean towel.            10.  Wear clean pajamas.            11.  Place clean sheets on your bed the night of your first shower and do not  sleep with pets. Day of Surgery : Do not apply any lotions/deodorants the morning of surgery.  Please wear clean clothes to the hospital/surgery center.  FAILURE TO FOLLOW THESE INSTRUCTIONS MAY RESULT IN THE CANCELLATION OF YOUR SURGERY PATIENT SIGNATURE_________________________________  NURSE SIGNATURE__________________________________  ________________________________________________________________________

## 2020-10-02 NOTE — Progress Notes (Signed)
Pt. Needs orders for upcoming surgery.PAT and labs on: 10/05/20. 

## 2020-10-05 ENCOUNTER — Encounter (HOSPITAL_COMMUNITY)
Admission: RE | Admit: 2020-10-05 | Discharge: 2020-10-05 | Disposition: A | Discharge status: Home / Self Care | Payer: Medicare Other | Source: Ambulatory Visit | Attending: Surgery | Admitting: Surgery

## 2020-10-05 ENCOUNTER — Other Ambulatory Visit: Payer: Self-pay

## 2020-10-05 ENCOUNTER — Other Ambulatory Visit (HOSPITAL_COMMUNITY)
Admission: RE | Admit: 2020-10-05 | Discharge: 2020-10-05 | Disposition: A | Discharge status: Home / Self Care | Payer: Medicare Other | Source: Ambulatory Visit | Attending: Surgery | Admitting: Surgery

## 2020-10-05 ENCOUNTER — Encounter (HOSPITAL_COMMUNITY): Payer: Self-pay

## 2020-10-05 DIAGNOSIS — Z01812 Encounter for preprocedural laboratory examination: Secondary | ICD-10-CM | POA: Insufficient documentation

## 2020-10-05 DIAGNOSIS — Z20822 Contact with and (suspected) exposure to covid-19: Secondary | ICD-10-CM | POA: Insufficient documentation

## 2020-10-05 LAB — BASIC METABOLIC PANEL
Anion gap: 10 (ref 5–15)
BUN: 13 mg/dL (ref 8–23)
CO2: 27 mmol/L (ref 22–32)
Calcium: 9.3 mg/dL (ref 8.9–10.3)
Chloride: 103 mmol/L (ref 98–111)
Creatinine, Ser: 0.77 mg/dL (ref 0.44–1.00)
GFR, Estimated: >60 mL/min (ref >60)
Glucose, Bld: 123 mg/dL — ABNORMAL HIGH (ref 70–99)
Potassium: 4.2 mmol/L (ref 3.5–5.1)
Sodium: 140 mmol/L (ref 135–145)

## 2020-10-05 LAB — CBC
HCT: 45.9 % (ref 36.0–46.0)
Hemoglobin: 15.2 g/dL — ABNORMAL HIGH (ref 12.0–15.0)
MCH: 30.8 pg (ref 26.0–34.0)
MCHC: 33.1 g/dL (ref 30.0–36.0)
MCV: 92.9 fL (ref 80.0–100.0)
Platelets: 249 10*3/uL (ref 150–400)
RBC: 4.94 MIL/uL (ref 3.87–5.11)
RDW: 12.5 % (ref 11.5–15.5)
WBC: 6.2 10*3/uL (ref 4.0–10.5)
nRBC: 0 % (ref 0.0–0.2)

## 2020-10-05 LAB — SARS CORONAVIRUS 2 (TAT 6-24 HRS): SARS Coronavirus 2: NEGATIVE

## 2020-10-05 NOTE — Progress Notes (Signed)
COVID Vaccine Completed: NO Date COVID Vaccine completed: COVID vaccine manufacturer: Pfizer    [facility]   PCP - Dr. [name]. Cardiologist -   Chest x-ray -  EKG -  Stress Test -  ECHO -  Cardiac Cath -  Pacemaker/ICD device last checked:  Sleep Study -  CPAP -   Fasting Blood Sugar -  Checks Blood Sugar _____ times a day  Blood Thinner Instructions: Aspirin Instructions: No instructions received. Last Dose:10/05/20  Anesthesia review:   Patient denies shortness of breath, fever, cough and chest pain at PAT appointment   Patient verbalized understanding of instructions that were given to them at the PAT appointment. Patient was also instructed that they will need to review over the PAT instructions again at home before surgery.

## 2020-10-06 ENCOUNTER — Inpatient Hospital Stay (HOSPITAL_COMMUNITY): Payer: Medicare Other | Admitting: Physician Assistant

## 2020-10-06 ENCOUNTER — Encounter (HOSPITAL_COMMUNITY): Payer: Self-pay | Admitting: Surgery

## 2020-10-06 ENCOUNTER — Encounter (HOSPITAL_COMMUNITY)
Admission: RE | Disposition: A | Discharge status: Home / Self Care | Payer: Self-pay | Source: Home / Self Care | Attending: Surgery

## 2020-10-06 ENCOUNTER — Inpatient Hospital Stay (HOSPITAL_COMMUNITY): Payer: Medicare Other | Admitting: Certified Registered Nurse Anesthetist

## 2020-10-06 ENCOUNTER — Ambulatory Visit (HOSPITAL_COMMUNITY)
Admission: RE | Admit: 2020-10-06 | Discharge: 2020-10-06 | Disposition: A | Discharge status: Home / Self Care | Payer: Medicare Other | Attending: Surgery | Admitting: Surgery

## 2020-10-06 DIAGNOSIS — Z882 Allergy status to sulfonamides status: Secondary | ICD-10-CM | POA: Diagnosis not present

## 2020-10-06 DIAGNOSIS — K219 Gastro-esophageal reflux disease without esophagitis: Secondary | ICD-10-CM | POA: Diagnosis not present

## 2020-10-06 DIAGNOSIS — Z7982 Long term (current) use of aspirin: Secondary | ICD-10-CM | POA: Diagnosis not present

## 2020-10-06 DIAGNOSIS — Z881 Allergy status to other antibiotic agents status: Secondary | ICD-10-CM | POA: Diagnosis not present

## 2020-10-06 DIAGNOSIS — X58XXXA Exposure to other specified factors, initial encounter: Secondary | ICD-10-CM | POA: Insufficient documentation

## 2020-10-06 DIAGNOSIS — Z833 Family history of diabetes mellitus: Secondary | ICD-10-CM | POA: Insufficient documentation

## 2020-10-06 DIAGNOSIS — Z8249 Family history of ischemic heart disease and other diseases of the circulatory system: Secondary | ICD-10-CM | POA: Diagnosis not present

## 2020-10-06 DIAGNOSIS — Z79899 Other long term (current) drug therapy: Secondary | ICD-10-CM | POA: Diagnosis not present

## 2020-10-06 DIAGNOSIS — T859XXA Unspecified complication of internal prosthetic device, implant and graft, initial encounter: Principal | ICD-10-CM | POA: Insufficient documentation

## 2020-10-06 SURGERY — REMOVAL, GASTRIC BAND, LAPAROSCOPIC
Anesthesia: General | Site: Abdomen

## 2020-10-06 MED ORDER — LIDOCAINE HCL (PF) 2 % IJ SOLN
INTRAMUSCULAR | Status: DC | PRN
  Administered 2020-10-06: 1.5 mg/kg/h via INTRADERMAL

## 2020-10-06 MED ORDER — METRONIDAZOLE 500 MG/100ML IV SOLN
INTRAVENOUS | Status: AC
  Filled 2020-10-06: qty 100

## 2020-10-06 MED ORDER — ORAL CARE MOUTH RINSE: 15.0000 mL | Freq: Once | OROMUCOSAL | Status: AC

## 2020-10-06 MED ORDER — ROCURONIUM BROMIDE 10 MG/ML (PF) SYRINGE
PREFILLED_SYRINGE | INTRAVENOUS | Status: AC
  Filled 2020-10-06: qty 10

## 2020-10-06 MED ORDER — METRONIDAZOLE 500 MG/100ML IV SOLN
500.0000 mg | Freq: Once | INTRAVENOUS | Status: AC
  Administered 2020-10-06: 500 mg via INTRAVENOUS

## 2020-10-06 MED ORDER — DEXAMETHASONE SODIUM PHOSPHATE 10 MG/ML IJ SOLN
INTRAMUSCULAR | Status: AC
  Filled 2020-10-06: qty 1

## 2020-10-06 MED ORDER — PROPOFOL 10 MG/ML IV BOLUS
INTRAVENOUS | Status: DC | PRN
  Administered 2020-10-06: 120 mg via INTRAVENOUS

## 2020-10-06 MED ORDER — MIDAZOLAM HCL 2 MG/2ML IJ SOLN
INTRAMUSCULAR | Status: AC
  Filled 2020-10-06: qty 2

## 2020-10-06 MED ORDER — 0.9 % SODIUM CHLORIDE (POUR BTL) OPTIME
TOPICAL | Status: DC | PRN
  Administered 2020-10-06: 1000 mL

## 2020-10-06 MED ORDER — CIPROFLOXACIN IN D5W 400 MG/200ML IV SOLN
INTRAVENOUS | Status: AC
  Filled 2020-10-06: qty 200

## 2020-10-06 MED ORDER — LIDOCAINE 2% (20 MG/ML) 5 ML SYRINGE
INTRAMUSCULAR | Status: AC
  Filled 2020-10-06: qty 5

## 2020-10-06 MED ORDER — LACTATED RINGERS IR SOLN
Status: DC | PRN
  Administered 2020-10-06: 1000 mL

## 2020-10-06 MED ORDER — CHLORHEXIDINE GLUCONATE 0.12 % MT SOLN
15.0000 mL | Freq: Once | OROMUCOSAL | Status: AC
  Administered 2020-10-06: 15 mL via OROMUCOSAL

## 2020-10-06 MED ORDER — PHENYLEPHRINE HCL (PRESSORS) 10 MG/ML IV SOLN
INTRAVENOUS | Status: DC | PRN
  Administered 2020-10-06 (×4): 80 ug via INTRAVENOUS

## 2020-10-06 MED ORDER — FENTANYL CITRATE (PF) 100 MCG/2ML IJ SOLN
INTRAMUSCULAR | Status: DC | PRN
  Administered 2020-10-06 (×2): 50 ug via INTRAVENOUS

## 2020-10-06 MED ORDER — BUPIVACAINE LIPOSOME 1.3 % IJ SUSP
20.0000 mL | Freq: Once | INTRAMUSCULAR | Status: AC
  Administered 2020-10-06: 20 mL
  Filled 2020-10-06: qty 20

## 2020-10-06 MED ORDER — OXYCODONE-ACETAMINOPHEN 5-325 MG PO TABS: 1.0000 | ORAL_TABLET | ORAL | 0 refills | Status: AC | PRN

## 2020-10-06 MED ORDER — FENTANYL CITRATE (PF) 100 MCG/2ML IJ SOLN: 25.0000 ug | INTRAMUSCULAR | Status: DC | PRN

## 2020-10-06 MED ORDER — FENTANYL CITRATE (PF) 100 MCG/2ML IJ SOLN
INTRAMUSCULAR | Status: AC
  Filled 2020-10-06: qty 2

## 2020-10-06 MED ORDER — PHENYLEPHRINE 40 MCG/ML (10ML) SYRINGE FOR IV PUSH (FOR BLOOD PRESSURE SUPPORT)
PREFILLED_SYRINGE | INTRAVENOUS | Status: AC
  Filled 2020-10-06: qty 20

## 2020-10-06 MED ORDER — CIPROFLOXACIN IN D5W 400 MG/200ML IV SOLN
400.0000 mg | Freq: Once | INTRAVENOUS | Status: AC
  Administered 2020-10-06: 400 mg via INTRAVENOUS

## 2020-10-06 MED ORDER — ONDANSETRON HCL 4 MG/2ML IJ SOLN
INTRAMUSCULAR | Status: AC
  Filled 2020-10-06: qty 2

## 2020-10-06 MED ORDER — KETAMINE HCL 10 MG/ML IJ SOLN
INTRAMUSCULAR | Status: DC | PRN
  Administered 2020-10-06: 40 mg via INTRAVENOUS

## 2020-10-06 MED ORDER — PHENYLEPHRINE 40 MCG/ML (10ML) SYRINGE FOR IV PUSH (FOR BLOOD PRESSURE SUPPORT)
PREFILLED_SYRINGE | INTRAVENOUS | Status: AC
  Filled 2020-10-06: qty 10

## 2020-10-06 MED ORDER — ONDANSETRON HCL 4 MG/2ML IJ SOLN
INTRAMUSCULAR | Status: DC | PRN
  Administered 2020-10-06: 4 mg via INTRAVENOUS

## 2020-10-06 MED ORDER — BUPIVACAINE-EPINEPHRINE 0.25% -1:200000 IJ SOLN
INTRAMUSCULAR | Status: DC | PRN
  Administered 2020-10-06: 30 mL

## 2020-10-06 MED ORDER — LACTATED RINGERS IV SOLN: INTRAVENOUS | Status: DC

## 2020-10-06 MED ORDER — BUPIVACAINE-EPINEPHRINE (PF) 0.25% -1:200000 IJ SOLN
INTRAMUSCULAR | Status: AC
  Filled 2020-10-06: qty 30

## 2020-10-06 MED ORDER — SUGAMMADEX SODIUM 200 MG/2ML IV SOLN
INTRAVENOUS | Status: DC | PRN
  Administered 2020-10-06: 300 mg via INTRAVENOUS

## 2020-10-06 MED ORDER — ROCURONIUM BROMIDE 10 MG/ML (PF) SYRINGE
PREFILLED_SYRINGE | INTRAVENOUS | Status: DC | PRN
  Administered 2020-10-06: 80 mg via INTRAVENOUS

## 2020-10-06 MED ORDER — LIDOCAINE 2% (20 MG/ML) 5 ML SYRINGE
INTRAMUSCULAR | Status: DC | PRN
  Administered 2020-10-06: 80 mg via INTRAVENOUS

## 2020-10-06 MED ORDER — KETAMINE HCL 10 MG/ML IJ SOLN
INTRAMUSCULAR | Status: AC
  Filled 2020-10-06: qty 1

## 2020-10-06 MED ORDER — ACETAMINOPHEN 500 MG PO TABS
1000.0000 mg | ORAL_TABLET | Freq: Once | ORAL | Status: AC
Start: 2020-10-06 — End: 2020-10-06
  Administered 2020-10-06: 1000 mg via ORAL
  Filled 2020-10-06: qty 2

## 2020-10-06 MED ORDER — DEXAMETHASONE SODIUM PHOSPHATE 10 MG/ML IJ SOLN
INTRAMUSCULAR | Status: DC | PRN
  Administered 2020-10-06: 10 mg via INTRAVENOUS

## 2020-10-06 MED ORDER — PROPOFOL 10 MG/ML IV BOLUS
INTRAVENOUS | Status: AC
  Filled 2020-10-06: qty 20

## 2020-10-06 MED ORDER — MIDAZOLAM HCL 5 MG/5ML IJ SOLN
INTRAMUSCULAR | Status: DC | PRN
  Administered 2020-10-06: 2 mg via INTRAVENOUS

## 2020-10-06 MED ORDER — SUGAMMADEX SODIUM 500 MG/5ML IV SOLN
INTRAVENOUS | Status: AC
  Filled 2020-10-06: qty 5

## 2020-10-06 SURGICAL SUPPLY — 75 items
BAG COUNTER SPONGE SURGICOUNT (BAG) ×3 IMPLANT
BENZOIN TINCTURE PRP APPL 2/3 (GAUZE/BANDAGES/DRESSINGS) IMPLANT
BINDER ABDOMINAL 12 ML 46-62 (SOFTGOODS) IMPLANT
BLADE HEX COATED 2.75 (ELECTRODE) ×3 IMPLANT
BLADE SURG 15 STRL LF DISP TIS (BLADE) ×2 IMPLANT
BLADE SURG 15 STRL SS (BLADE) ×1
CHLORAPREP W/TINT 26 (MISCELLANEOUS) ×3 IMPLANT
COVER SURGICAL LIGHT HANDLE (MISCELLANEOUS) ×3 IMPLANT
DECANTER SPIKE VIAL GLASS SM (MISCELLANEOUS) ×6 IMPLANT
DERMABOND ADVANCED (GAUZE/BANDAGES/DRESSINGS) ×1
DERMABOND ADVANCED .7 DNX12 (GAUZE/BANDAGES/DRESSINGS) ×2 IMPLANT
DEVICE SUT QUICK LOAD TK 5 (STAPLE) ×9 IMPLANT
DEVICE SUT TI-KNOT TK 5X26 (MISCELLANEOUS) ×3 IMPLANT
DEVICE SUTURE ENDOST 10MM (ENDOMECHANICALS) IMPLANT
DISSECTOR BLUNT TIP ENDO 5MM (MISCELLANEOUS) IMPLANT
DRAIN CHANNEL 19F RND (DRAIN) IMPLANT
DRAPE LAPAROSCOPIC ABDOMINAL (DRAPES) ×3 IMPLANT
ELECT BLADE TIP CTD 4 INCH (ELECTRODE) ×3 IMPLANT
ELECT PENCIL ROCKER SW 15FT (MISCELLANEOUS) ×3 IMPLANT
ELECT REM PT RETURN 15FT ADLT (MISCELLANEOUS) ×3 IMPLANT
EVACUATOR SILICONE 100CC (DRAIN) IMPLANT
GAUZE SPONGE 4X4 12PLY STRL (GAUZE/BANDAGES/DRESSINGS) ×3 IMPLANT
GLOVE SRG 8 PF TXTR STRL LF DI (GLOVE) ×2 IMPLANT
GLOVE SURG ENC MOIS LTX SZ7.5 (GLOVE) ×3 IMPLANT
GLOVE SURG ENC TEXT LTX SZ8 (GLOVE) ×3 IMPLANT
GLOVE SURG UNDER POLY LF SZ7 (GLOVE) ×3 IMPLANT
GLOVE SURG UNDER POLY LF SZ8 (GLOVE) ×1
GOWN SPEC L4 XLG W/TWL (GOWN DISPOSABLE) ×3 IMPLANT
GOWN STRL REUS W/TWL LRG LVL3 (GOWN DISPOSABLE) ×3 IMPLANT
GOWN STRL REUS W/TWL XL LVL3 (GOWN DISPOSABLE) ×9 IMPLANT
KIT BASIN OR (CUSTOM PROCEDURE TRAY) ×3 IMPLANT
KIT TURNOVER KIT A (KITS) ×3 IMPLANT
MARKER SKIN DUAL TIP RULER LAB (MISCELLANEOUS) ×3 IMPLANT
MAT PREVALON FULL STRYKER (MISCELLANEOUS) ×3 IMPLANT
NEEDLE SPNL 22GX3.5 QUINCKE BK (NEEDLE) ×3 IMPLANT
NS IRRIG 1000ML POUR BTL (IV SOLUTION) ×3 IMPLANT
PACK GENERAL/GYN (CUSTOM PROCEDURE TRAY) ×3 IMPLANT
PACK UNIVERSAL I (CUSTOM PROCEDURE TRAY) ×3 IMPLANT
PENCIL SMOKE EVACUATOR (MISCELLANEOUS) IMPLANT
SET IRRIG TUBING LAPAROSCOPIC (IRRIGATION / IRRIGATOR) IMPLANT
SET TUBE SMOKE EVAC HIGH FLOW (TUBING) ×3 IMPLANT
SHEARS HARMONIC ACE PLUS 36CM (ENDOMECHANICALS) IMPLANT
SHEARS HARMONIC ACE PLUS 45CM (MISCELLANEOUS) IMPLANT
SLEEVE ADV FIXATION 5X100MM (TROCAR) IMPLANT
SLEEVE XCEL OPT CAN 5 100 (ENDOMECHANICALS) ×9 IMPLANT
SOL ANTI FOG 6CC (MISCELLANEOUS) ×2 IMPLANT
SOLUTION ANTI FOG 6CC (MISCELLANEOUS) ×1
SPONGE DRAIN TRACH 4X4 STRL 2S (GAUZE/BANDAGES/DRESSINGS) IMPLANT
SPONGE T-LAP 18X18 ~~LOC~~+RFID (SPONGE) ×3 IMPLANT
STAPLER VISISTAT 35W (STAPLE) ×3 IMPLANT
STRIP CLOSURE SKIN 1/2X4 (GAUZE/BANDAGES/DRESSINGS) IMPLANT
SUT ETHIBOND 2 0 SH (SUTURE) ×3
SUT ETHIBOND 2 0 SH 36X2 (SUTURE) ×6 IMPLANT
SUT ETHILON 2 0 PS N (SUTURE) IMPLANT
SUT MNCRL AB 4-0 PS2 18 (SUTURE) ×3 IMPLANT
SUT PDS AB 2-0 CT2 27 (SUTURE) ×12 IMPLANT
SUT PROLENE 2 0 CT2 30 (SUTURE) ×3 IMPLANT
SUT SILK 0 (SUTURE)
SUT SILK 0 30XBRD TIE 6 (SUTURE) IMPLANT
SUT SURGIDAC NAB ES-9 0 48 120 (SUTURE) IMPLANT
SUT VIC AB 2-0 SH 27 (SUTURE)
SUT VIC AB 2-0 SH 27X BRD (SUTURE) IMPLANT
SUT VIC AB 3-0 SH 8-18 (SUTURE) ×3 IMPLANT
SUT VIC AB 4-0 SH 18 (SUTURE) ×3 IMPLANT
SYR 20ML LL LF (SYRINGE) ×3 IMPLANT
SYR 30ML LL (SYRINGE) ×3 IMPLANT
TOWEL OR 17X26 10 PK STRL BLUE (TOWEL DISPOSABLE) ×6 IMPLANT
TOWEL OR NON WOVEN STRL DISP B (DISPOSABLE) ×3 IMPLANT
TRAY FOLEY MTR SLVR 16FR STAT (SET/KITS/TRAYS/PACK) IMPLANT
TROCAR ADV FIXATION 11X100MM (TROCAR) IMPLANT
TROCAR BLADELESS 15MM (ENDOMECHANICALS) ×3 IMPLANT
TROCAR BLADELESS OPT 5 100 (ENDOMECHANICALS) ×3 IMPLANT
TROCAR XCEL NON-BLD 11X100MML (ENDOMECHANICALS) ×3 IMPLANT
TROCAR XCEL UNIV SLVE 11M 100M (ENDOMECHANICALS) IMPLANT
TUBE CALIBRATION LAPBAND (TUBING) ×3 IMPLANT

## 2020-10-06 NOTE — H&P (Signed)
Admitting Physician: Hyman Hopes Maxson Oddo  Service: General surgery  CC: Lap band removal  Subjective   HPI: Joy Charles is an 70 y.o. female who is here for elective laparoscopic removal of lap band  Past Medical History:  Diagnosis Date   Hypertension    Obesity     Past Surgical History:  Procedure Laterality Date   ABDOMINAL HYSTERECTOMY  03/28/1992   ANTERIOR AND POSTERIOR VAGINAL REPAIR  03/28/2002   BREATH TEK H PYLORI N/A 08/01/2012   Procedure: BREATH TEK H PYLORI;  Surgeon: Kandis Cocking, MD;  Location: [facility];  Service: General;  Laterality: N/A;   CATARACT EXTRACTION W/ INTRAOCULAR LENS IMPLANT Bilateral    LAPAROSCOPIC GASTRIC BANDING WITH HIATAL HERNIA REPAIR N/A 01/15/2013   Procedure: LAPAROSCOPIC GASTRIC BANDING,  HIATAL HERNIA REPAIR;  Surgeon: Kandis Cocking, MD;  Location: [facility];  Service: General;  Laterality: N/A;   MESH APPLIED TO LAP PORT N/A 01/15/2013   Procedure: MESH APPLIED TO LAP PORT;  Surgeon: Kandis Cocking, MD;  Location: [facility];  Service: General;  Laterality: N/A;   VEIN LIGATION AND STRIPPING      Family History  Problem Relation Age of Onset   Heart disease Mother    Diabetes Father    Diabetes Brother     Social:  reports that she has never smoked. She has never used smokeless tobacco. She reports current alcohol use. She reports that she does not use drugs.  Allergies:  Allergies  Allergen Reactions   Cephalosporins Hives and Rash   Sulfa Antibiotics Hives and Itching   Elemental Sulfur Rash    Medications: Current Outpatient Medications  Medication Instructions   amLODipine (NORVASC) 5 mg, Oral, Every morning   aspirin 81 mg, Oral, Daily   losartan-hydrochlorothiazide (HYZAAR) 50-12.5 MG tablet 1 tablet, Oral, Daily   omeprazole (PRILOSEC) 40 mg, Oral, Daily    ROS - all of the below systems have been reviewed with the patient and positives are indicated with bold text General: chills, fever or night  sweats Eyes: blurry vision or double vision ENT: epistaxis or sore throat Allergy/Immunology: itchy/watery eyes or nasal congestion Hematologic/Lymphatic: bleeding problems, blood clots or swollen lymph nodes Endocrine: temperature intolerance or unexpected weight changes Breast: new or changing breast lumps or nipple discharge Resp: cough, shortness of breath, or wheezing CV: chest pain or dyspnea on exertion GI: as per HPI GU: dysuria, trouble voiding, or hematuria MSK: joint pain or joint stiffness Neuro: TIA or stroke symptoms Derm: pruritus and skin lesion changes Psych: anxiety and depression  Objective   PE Blood pressure 138/86, pulse 75, temperature 97.8 F (36.6 C), temperature source Oral, SpO2 100 %. Constitutional: NAD; conversant; no deformities Eyes: Moist conjunctiva; no lid lag; anicteric; PERRL Neck: Trachea midline; no thyromegaly Lungs: Normal respiratory effort; no tactile fremitus CV: RRR; no palpable thrills; no pitting edema GI: Abd soft, non-tender, palpable lap band; no palpable hepatosplenomegaly MSK: Normal range of motion of extremities; no clubbing/cyanosis Psychiatric: Appropriate affect; alert and oriented x3 Lymphatic: No palpable cervical or axillary lymphadenopathy  No results found for this or any previous visit (from the past 24 hour(s)).  Imaging Orders  No imaging studies ordered today     Assessment and Plan   Ms. Langhans has developed severe reflux symptoms and would like her LAP-BAND removed. We will schedule her for an upper GI with radiology to assess for reflux, and hiatal hernia. Once this result is back, we will schedule her  for lap band removal. She is not interested in conversion to a sleeve or bypass and understands the risk of weight regain. The procedure of laparotomy and removal with possible hiatal hernia repair was described to the patient. The procedure itself as well as its risks, benefits, and alternatives were discussed.  I reviewed the upper GI images with the patient . She does have a small hiatal hernia. We will proceed with a laparoscopic LAP-BAND removal with hiatal hernia repair and possible fundoplication.  No diagnosis found.   Quentin Ore, MD  [facility] Use [email or web address] to contact on call provider

## 2020-10-06 NOTE — Anesthesia Postprocedure Evaluation (Signed)
Anesthesia Post Note  Patient: Joy Charles  Procedure(s) Performed: LAPAROSCOPIC REMOVAL OF GASTRIC BAND UPPER ENDOSCOPY (Abdomen)     Patient location during evaluation: PACU Anesthesia Type: General Level of consciousness: awake and alert Pain management: pain level controlled Vital Signs Assessment: post-procedure vital signs reviewed and stable Respiratory status: spontaneous breathing, nonlabored ventilation, respiratory function stable and patient connected to nasal cannula oxygen Cardiovascular status: blood pressure returned to baseline and stable Postop Assessment: no apparent nausea or vomiting Anesthetic complications: no   No notable events documented.  Last Vitals:  Vitals:   10/06/20 1536 10/06/20 1545  BP: 138/68 130/67  Pulse: 91 73  Resp: 18 16  Temp: 36.7 C   SpO2: 96% 96%    Last Pain:  Vitals:   10/06/20 1545  TempSrc:   PainSc: 0-No pain                 Marvel Sapp L Doratha Mcswain

## 2020-10-06 NOTE — Anesthesia Procedure Notes (Addendum)
Procedure Name: Intubation Date/Time: 10/06/2020 2:31 PM Performed by: West Pugh, CRNA Pre-anesthesia Checklist: Patient identified, Emergency Drugs available, Suction available, Patient being monitored and Timeout performed Patient Re-evaluated:Patient Re-evaluated prior to induction Oxygen Delivery Method: Circle system utilized Preoxygenation: Pre-oxygenation with 100% oxygen Induction Type: IV induction Ventilation: Mask ventilation without difficulty Laryngoscope Size: Mac and 3 Grade View: Grade I Tube type: Oral Tube size: 7.0 mm Number of attempts: 1 Airway Equipment and Method: Stylet Placement Confirmation: ETT inserted through vocal cords under direct vision, positive ETCO2, CO2 detector and breath sounds checked- equal and bilateral Secured at: 20 cm Tube secured with: Tape Dental Injury: Teeth and Oropharynx as per pre-operative assessment

## 2020-10-06 NOTE — Op Note (Signed)
Patient: Joy Charles (1950-11-13, [id number])  Date of Surgery: 10/06/2020   Preoperative Diagnosis: Dysfunctional lap band, small hiatal hernia  Postoperative Diagnosis: Dysfunctional lap band  Surgical Procedure: LAPAROSCOPIC REMOVAL OF GASTRIC BAND UPPER ENDOSCOPY:    Operative Team Members:  Surgeon(s) and Role:    * Helene Bernstein, Hyman Hopes, MD - Primary    * Luretha Murphy, MD - Assisting   Anesthesiologist: Elmer Picker, MD CRNA: Wynonia Sours, CRNA; Maree Erie, CRNA   Anesthesia: General   Fluids:  Total I/O In: 1000 [I.V.:1000] Out: 50 [Blood:50]  Complications: None  Drains:  none   Specimen: None   Disposition:  PACU - hemodynamically stable.  Plan of Care: Discharge to home after PACU   Indications for Procedure: Joy Charles is a 70 y.o. female who presented with signs and symptoms of an dysfunctional gastric band.  Imaging was reviewed.  The risks and benefits were discussed with the patient and family in extensive detail and the patient wished to proceed with removal of the adjustable gastric band and all components, intra-abdominal and subcutaneous.  Findings: No significant hiatal hernia on endoscopic and laparoscopic inspection, lap band removed and decision made not to perform hiatal hernia repair  Infection status: Patient: Private Patient Elective Case Case: Elective Infection Present At Time Of Surgery (PATOS): None   Description of Procedure:   The patient was taken to the operating suite and placed in supine position.  General endotracheal anesthesia was induced.  Bilateral compression devices were placed on the patient's lower extremities to prevent blood clots.  The abdomen was then prepped and draped in the usual fashion.  Antibiotics were given just prior to the case start.  A time-out was completed verifying the correct patient, procedure, positioning and equipment needed for the case.  A small incision was made near the  tip of the 12th rib in the right upper quadrant. A FIOS  first entry trocar (5 mm) was used to gain safe access into the abdominal cavity without underlying bowel injury. The abdominal cavity was inspected. 2 additional 5 mm trochars were placed in the LUQ.  A 15 mm trocar was then placed above the location of the subcutaneous reservoir.  A liver retractor was placed near the xiphoid process to elevate the left lobe of the liver..    The band was identified.  We followed the tubing until we reached the balloon.  The harmonic scalpel was employed to dissect tissue from the buckle of the gastric band.  Once it was free from its surrounding attachments were were able to divide the device and remove it from the pocket surrounding the stomach.  An endoscopy was performed to insure patency of the upper stomach. Fluid was introduced laparoscopically to the upper abdomen.  As the endoscope reached the stomach, no air bubbles were seen laparoscopically.  The stomach demonstrated normal gastric and duodenal anatomy.  A retroflex view was employed to visualize the cardia.  No abnormalities were seen.  The band balloon was then removed from the abdominal cavity via the 15 mm port.  The endoscope was used to deflate the stomach.  Good hemostasis was observed intra-abdominally. The fluid was sectioned out of the abdomen and the liver retractor removed.  All ports were then removed and the abdomen deflated.   The subcutaneous reservoir and attached mesh was then dissected free of the surrounding tissue with bovie electrocautery.  It was freed from the fascia and subcutaneous tissue. The subcutaneous reservoir and  all remaining components of the adjustable gastric band were removed.  The subcutaneous pocket was irrigated.  The 15 mm port site was closed with vicryl sutures and a PMI.  The wound was then closed in layers with vicryl and monocryl. All other ports were closed with monocryl.  A laparoscopically guided  TAP block was completed.  The patient was awakened from anesthesia.  A post procedure timeout verified that all instrument, needles, and sponge counts were correct.  The patient was then extubated and transferred to the recovery room in a stable condition.   Ivar Drape, MD General, Bariatric, & Minimally Invasive Surgery [facility], [place]

## 2020-10-06 NOTE — Discharge Instructions (Addendum)
LAPAROSCOPIC BAND REMOVAL POST OPERATIVE INSTRUCTIONS  Thinking Clearly  The anesthesia may cause you to feel different for 1 or 2 days. Do not drive, drink alcohol, or make any big decisions for at least 2 days.  Nutrition When you wake up, you will be able to drink small amounts of liquid. If you do not feel sick, you can slowly advance your diet to regular foods. Continue to drink lots of fluids, usually about 8 to 10 glasses per day. Eat a high-fiber diet so you don't strain during bowel movements. High-Fiber Foods Foods high in fiber include beans, bran cereals and whole-grain breads, peas, dried fruit (figs, apricots, and dates), raspberries, blackberries, strawberries, sweet corn, broccoli, baked potatoes with skin, plums, pears, apples, greens, and nuts. Activity Slowly increase your activity. Be sure to get up and walk every hour or so to prevent blood clots. No heavy lifting or strenuous activity for 4 weeks following surgery to prevent hernias at your incision sites It is normal to feel tired. You may need more sleep than usual.  Get your rest but make sure to get up and move around frequently to prevent blood clots and pneumonia.  Work and Return to [employer] can go back to work when you feel well enough. Discuss the timing with your surgeon. You can usually go back to school or work 1 week after an operation. If your work requires heavy lifting or strenuous activity you need to be placed on light duty for 4 weeks following surgery. You can return to gym class, sports or other physical activities 4 weeks after surgery.  Wound Care Always wash your hands before and after touching near your incision site. Do not soak in a bathtub until cleared at your follow up appointment. You may take a shower 24 hours after surgery. A small amount of drainage from the incision is normal. If the drainage is thick and yellow or the site is red, you may have an infection, so call your  surgeon. If you have a drain in one of your incisions, it will be taken out in office when the drainage stops. Steri-Strips will fall off in 7 to 10 days or they will be removed during your first office visit. If you have dermabond glue covering over the incision, allow the glue to flake off on its own. Avoid wearing tight or rough clothing. It may rub your incisions and make it harder for them to heal. Protect the new skin, especially from the sun. The sun can burn and cause darker scarring. Your scar will heal in about 4 to 6 weeks and will become softer and continue to fade over the next year.  The cosmetic appearance of the incisions will improve over the course of the first year after surgery. Sensation around your incision will return in a few weeks or months.  Bowel Movements After intestinal surgery, you may have loose watery stools for several days. If watery diarrhea lasts longer than 3 days, contact your surgeon. Pain medication (narcotics) can cause constipation. Increase the fiber in your diet with high-fiber foods if you are constipated. You can take an over the counter stool softener like Colace to avoid constipation.  Additional over the counter medications can also be used if Colace isn't sufficient (for example, Milk of Magnesia or Miralax).  Pain The amount of pain is different for each person. Some people need only 1 to 3 doses of pain control medication, while others need more. Take alternating doses  of tylenol and ibuprofen around the clock for the first five days following surgery.  This will provide a baseline of pain control and help with inflammation.  Take the narcotic pain medication in addition if needed for severe pain.  Contact Your Surgeon at [phone], if you have: Pain in your right upper abdomen like a gallbladder attack. Pain that will not go away Pain that gets worse A fever of more than 101F (38.3C) Repeated vomiting Swelling, redness, bleeding, or  bad-smelling drainage from your wound site Strong abdominal pain No bowel movement or unable to pass gas for 3 days Watery diarrhea lasting longer than 3 days  Pain Control The goal of pain control is to minimize pain, keep you moving and help you heal. Your surgical team will work with you on your pain plan. Most often a combination of therapies and medications are used to control your pain. You may also be given medication (local anesthetic) at the surgical site. This may help control your pain for several days. Extreme pain puts extra stress on your body at a time when your body needs to focus on healing. Do not wait until your pain has reached a level "10" or is unbearable before telling your doctor or nurse. It is much easier to control pain before it becomes severe. Following a laparoscopic procedure, pain is sometimes felt in the shoulder. This is due to the gas inserted into your abdomen during the procedure. Moving and walking helps to decrease the gas and the right shoulder pain.  Use the guide below for ways to manage your post-operative pain. Learn more by going to facs.org/safepaincontrol.  How Intense Is My Pain Common Therapies to Feel Better       I hardly notice my pain, and it does not interfere with my activities.  I notice my pain and it distracts me, but I can still do activities (sitting up, walking, standing).  Non-Medication Therapies  Ice (in a bag, applied over clothing at the surgical site), elevation, rest, meditation, massage, distraction (music, TV, play) walking and mild exercise Splinting the abdomen with pillows +  Non-Opioid Medications Acetaminophen (Tylenol) Non-steroidal anti-inflammatory drugs (NSAIDS) Aspirin, Ibuprofen (Motrin, Advil) Naproxen (Aleve) Take these as needed, when you feel pain. Both acetaminophen and NSAIDs help to decrease pain and swelling (inflammation).      My pain is hard to ignore and is more noticeable even when I  rest.  My pain interferes with my usual activities.  Non-Medication Therapies  +  Non-Opioid medications  Take on a regular schedule (around-the-clock) instead of as needed. (For example, Tylenol every 6 hours at 9:00 am, 3:00 pm, 9:00 pm, 3:00 am and Motrin every 6 hours at 12:00 am, 6:00 am, 12:00 pm, 6:00 pm)         I am focused on my pain, and I am not doing my daily activities.  I am groaning in pain, and I cannot sleep. I am unable to do anything.  My pain is as bad as it could be, and nothing else matters.  Non-Medication Therapies  +  Around-the-Clock Non-Opioid Medications  +  Short-acting opioids  Opioids should be used with other medications to manage severe pain. Opioids block pain and give a feeling of euphoria (feel high). Addiction, a serious side effect of opioids, is rare with short-term (a few days) use.  Examples of short-acting opioids include: Tramadol (Ultram), Hydrocodone (Norco, Vicodin), Hydromorphone (Dilaudid), Oxycodone (Oxycontin)     The above directions have been  adapted from the [employer] of [employer] Surgical Patient Education Program.  Please refer to the ACS website if needed: FreakyMates.de.ashx.   Ivar Drape, MD [facility] [address], [address], [place], [place]  [address] ?  [address] [address], [place], [place]   [address] [phone] ? [phone] ? FAX [phone] Web site: [email or web address]

## 2020-10-06 NOTE — Op Note (Signed)
Joy Charles [id number] 1950-09-23 10/06/2020  Preoperative diagnosis: lapband dysfunction  Postoperative diagnosis: Same   Procedure: Upper endoscopy   Surgeon: Susy Frizzle B. Daphine Deutscher  M.D., FACS   Anesthesia: Gen.   Indications for procedure: This patient was undergoing a removal of lapband and the integrity of the lapband tract was in question.    Description of procedure: The endoscopy was placed in the mouth and into the oropharynx and under endoscopic vision it was advanced to the esophagogastric junction.  The scope passed in to the stomach and there was no evidence of erosion of the lapband.    The anatomy appeared normal and there was no evidence of advanced reflux.     The scope was withdrawn without difficulty.     Matt B. Daphine Deutscher, MD, FACS General, Bariatric, & Minimally Invasive Surgery [facility], [place]

## 2020-10-06 NOTE — Anesthesia Preprocedure Evaluation (Addendum)
Anesthesia Evaluation  Patient identified by MRN, date of birth, ID band Patient awake    Reviewed: Allergy & Precautions, NPO status , Patient's Chart, lab work & pertinent test results  Airway Mallampati: II  TM Distance: >3 FB Neck ROM: Full  Mouth opening: Limited Mouth Opening  Dental  (+) Missing, Dental Advisory Given, Chipped,    Pulmonary neg pulmonary ROS,    Pulmonary exam normal breath sounds clear to auscultation       Cardiovascular hypertension, Pt. on medications Normal cardiovascular exam Rhythm:Regular Rate:Normal     Neuro/Psych negative neurological ROS  negative psych ROS   GI/Hepatic Neg liver ROS, GERD  Medicated and Controlled,  Endo/Other  negative endocrine ROS  Renal/GU negative Renal ROS  negative genitourinary   Musculoskeletal negative musculoskeletal ROS (+)   Abdominal   Peds  Hematology negative hematology ROS (+)   Anesthesia Other Findings   Reproductive/Obstetrics                            Anesthesia Physical Anesthesia Plan  ASA: 2  Anesthesia Plan: General   Post-op Pain Management:    Induction: Intravenous  PONV Risk Score and Plan: 3 and Midazolam, Dexamethasone and Ondansetron  Airway Management Planned: Oral ETT  Additional Equipment:   Intra-op Plan:   Post-operative Plan: Extubation in OR  Informed Consent: I have reviewed the patients History and Physical, chart, labs and discussed the procedure including the risks, benefits and alternatives for the proposed anesthesia with the patient or authorized representative who has indicated his/her understanding and acceptance.     Dental advisory given  Plan Discussed with: CRNA  Anesthesia Plan Comments:         Anesthesia Quick Evaluation

## 2020-10-06 NOTE — Transfer of Care (Signed)
Immediate Anesthesia Transfer of Care Note  Patient: Joy Charles  Procedure(s) Performed: LAPAROSCOPIC REMOVAL OF GASTRIC BAND UPPER ENDOSCOPY (Abdomen)  Patient Location: PACU  Anesthesia Type:General  Level of Consciousness: awake, alert  and oriented  Airway & Oxygen Therapy: Patient Spontanous Breathing  Post-op Assessment: Report given to RN and Post -op Vital signs reviewed and stable  Post vital signs: Reviewed and stable  Last Vitals:  Vitals Value Taken Time  BP    Temp    Pulse    Resp    SpO2      Last Pain:  Vitals:   10/06/20 1133  TempSrc: Oral         Complications: No notable events documented.

## 2020-10-09 NOTE — OR Nursing (Signed)
Accessed chart to remove the Band Lap from implant tab.  Elna Breslow, RN, BSN 10/09/2020
